# Patient Record
Sex: Male | Born: 1985 | Race: White | Hispanic: No | Smoking: Never smoker
Health system: Southern US, Community
[De-identification: ages and names within clinical notes are randomized; demographics above are authoritative.]

## PROBLEM LIST (undated history)

## (undated) DIAGNOSIS — R011 Cardiac murmur, unspecified: Secondary | ICD-10-CM

## (undated) HISTORY — PX: FRACTURE SURGERY: SHX138

---
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
## 2012-01-05 ENCOUNTER — Encounter (HOSPITAL_COMMUNITY): Payer: Self-pay | Admitting: Emergency Medicine

## 2012-01-05 ENCOUNTER — Emergency Department (HOSPITAL_COMMUNITY)
Admission: EM | Admit: 2012-01-05 | Discharge: 2012-01-05 | Disposition: A | Discharge status: Home / Self Care | Payer: Self-pay | Attending: Emergency Medicine | Admitting: Emergency Medicine

## 2012-01-05 DIAGNOSIS — K047 Periapical abscess without sinus: Secondary | ICD-10-CM

## 2012-01-05 DIAGNOSIS — K089 Disorder of teeth and supporting structures, unspecified: Secondary | ICD-10-CM | POA: Insufficient documentation

## 2012-01-05 MED ORDER — PENICILLIN V POTASSIUM 500 MG PO TABS: 500.0000 mg | ORAL_TABLET | Freq: Four times a day (QID) | ORAL | Status: AC

## 2012-01-05 MED ORDER — LIDOCAINE HCL (PF) 1 % IJ SOLN
INTRAMUSCULAR | Status: AC
  Administered 2012-01-05: 13:00:00
  Filled 2012-01-05: qty 5

## 2012-01-05 MED ORDER — HYDROCODONE-ACETAMINOPHEN 5-325 MG PO TABS: 1.0000 | ORAL_TABLET | Freq: Four times a day (QID) | ORAL | Status: AC | PRN

## 2012-01-05 MED ORDER — CEFTRIAXONE SODIUM 1 G IJ SOLR
1.0000 g | Freq: Once | INTRAMUSCULAR | Status: AC
  Administered 2012-01-05: 1 g via INTRAMUSCULAR
  Filled 2012-01-05: qty 10

## 2012-01-05 NOTE — Discharge Instructions (Signed)
Call Dr. Bonnetta Barry office today and make an appointment for tomorrow or Friday.  Tell them you were in the er today and the ER doctor stated you needed to be seen this week.

## 2012-01-05 NOTE — ED Notes (Signed)
MD at bedside. 

## 2012-01-05 NOTE — ED Notes (Signed)
Pt with poor oral hygiene, several teeth have discolored and decayed, swelling to right lower jaw x 3 days per pt, denies having a dentist, pt states it Hurts to swallow, denies difficultly breathing

## 2012-01-05 NOTE — ED Notes (Signed)
Reports the pain is radiating into his neck.

## 2012-01-05 NOTE — ED Provider Notes (Cosign Needed)
History  This chart was scribed for Douglas Lennert, MD by Shari Heritage. The patient was seen in room APA02/APA02. Patient's care was started at 1255.     CSN: [id number]  Arrival date & time 01/05/12  1100   First MD Initiated Contact with Patient 01/05/12 1255      Chief Complaint  Patient presents with  . Dental Pain    Patient is a 26 y.o. male presenting with tooth pain. The history is provided by the patient. No language interpreter was used.  Dental PainThe primary symptoms include mouth pain. Primary symptoms do not include headaches or cough. The symptoms began 3 to 5 days ago. The symptoms are unchanged. The symptoms are new. The symptoms occur constantly.  Mouth pain began 3 - 5 days ago. Mouth pain occurs constantly. Mouth pain is unchanged. Affected locations include: teeth and gum(s).  Additional symptoms include: gum swelling. Additional symptoms do not include: fatigue.    Douglas Dennis is a 26 y.o. male who presents to the Emergency Department complaining of moderate to severe, constant, non-radiating right sided dental pain with significant swelling onset 3 days ago. Patient denies any other symptoms. Patient does not have a dentist. He reports no other significant medical or surgical history. He is not a smoker.   History reviewed. No pertinent past medical history.  History reviewed. No pertinent past surgical history.  No family history on file.  History  Substance Use Topics  . Smoking status: Never Smoker   . Smokeless tobacco: Not on file  . Alcohol Use: No      Review of Systems  Constitutional: Negative for fatigue.  HENT: Positive for dental problem. Negative for congestion, sinus pressure and ear discharge.   Eyes: Negative for discharge.  Respiratory: Negative for cough.   Cardiovascular: Negative for chest pain.  Gastrointestinal: Negative for abdominal pain and diarrhea.  Genitourinary: Negative for frequency and hematuria.    Musculoskeletal: Negative for back pain.  Skin: Negative for rash.  Neurological: Negative for seizures and headaches.  Hematological: Negative.   Psychiatric/Behavioral: Negative for hallucinations.    Allergies  Review of patient's allergies indicates no known allergies.  Home Medications   Current Outpatient Rx  Name Route Sig Dispense Refill  . ACETAMINOPHEN 500 MG PO TABS Oral Take 500-1,000 mg by mouth every 6 (six) hours as needed. Pain    . CLEAR EYES OP Both Eyes Place 1 drop into both eyes daily as needed. Clean eyes Out.      BP 123/67  Pulse 64  Temp 98.5 F (36.9 C) (Oral)  Resp 18  Ht 6' (1.829 m)  Wt 160 lb (72.576 kg)  BMI 21.70 kg/m2  SpO2 100%  Physical Exam  Constitutional: He is oriented to person, place, and time. He appears well-developed.  HENT:  Head: Normocephalic and atraumatic. No trismus in the jaw.  Mouth/Throat: Normal dentition.       Swelling to right side of jaw with tenderness. No trismus. Poor dentition.  Eyes: Conjunctivae normal and EOM are normal. No scleral icterus.  Neck: Neck supple. No thyromegaly present.  Cardiovascular: Normal rate and regular rhythm.  Exam reveals no gallop and no friction rub.   No murmur heard. Pulmonary/Chest: No stridor. He has no wheezes. He has no rales. He exhibits no tenderness.  Abdominal: He exhibits no distension. There is no tenderness. There is no rebound.  Musculoskeletal: Normal range of motion. He exhibits no edema.  Lymphadenopathy:    He has  no cervical adenopathy.  Neurological: He is oriented to person, place, and time. Coordination normal.  Skin: No rash noted. No erythema.  Psychiatric: He has a normal mood and affect. His behavior is normal.    ED Course  Procedures (including critical care time)  DIAGNOSTIC STUDIES: Oxygen Saturation is 100% on room air, normal by my interpretation.    COORDINATION OF CARE: 1:01pm- Patient informed of current plan for treatment and  evaluation and agrees with plan at this time.      Labs Reviewed - No data to display No results found.   No diagnosis found.    MDM       The chart was scribed for me under my direct supervision.  I personally performed the history, physical, and medical decision making and all procedures in the evaluation of this patient.Douglas Lennert, MD 01/05/12 [phone]

## 2012-01-05 NOTE — ED Notes (Signed)
Patient with c/o right sided dental pain and swelling that started 3 days ago. Reports no dentist.

## 2012-03-14 ENCOUNTER — Emergency Department (HOSPITAL_COMMUNITY)
Admission: EM | Admit: 2012-03-14 | Discharge: 2012-03-14 | Disposition: A | Discharge status: Home / Self Care | Payer: Self-pay | Attending: Emergency Medicine | Admitting: Emergency Medicine

## 2012-03-14 ENCOUNTER — Encounter (HOSPITAL_COMMUNITY): Payer: Self-pay | Admitting: Emergency Medicine

## 2012-03-14 DIAGNOSIS — K047 Periapical abscess without sinus: Secondary | ICD-10-CM | POA: Insufficient documentation

## 2012-03-14 MED ORDER — PENICILLIN V POTASSIUM 500 MG PO TABS: 500.0000 mg | ORAL_TABLET | Freq: Three times a day (TID) | ORAL | Status: DC

## 2012-03-14 MED ORDER — HYDROCODONE-ACETAMINOPHEN 5-325 MG PO TABS: 1.0000 | ORAL_TABLET | ORAL | Status: AC | PRN

## 2012-03-14 MED ORDER — HYDROCODONE-ACETAMINOPHEN 5-325 MG PO TABS
1.0000 | ORAL_TABLET | Freq: Once | ORAL | Status: AC
  Administered 2012-03-14: 1 via ORAL
  Filled 2012-03-14: qty 1

## 2012-03-14 MED ORDER — PENICILLIN V POTASSIUM 250 MG PO TABS
500.0000 mg | ORAL_TABLET | Freq: Once | ORAL | Status: AC
  Administered 2012-03-14: 500 mg via ORAL
  Filled 2012-03-14: qty 2

## 2012-03-14 NOTE — ED Provider Notes (Signed)
History     CSN: [id number]  Arrival date & time 03/14/12  1151   First MD Initiated Contact with Patient 03/14/12 1156      Chief Complaint  Patient presents with  . Jaw Pain    (Consider location/radiation/quality/duration/timing/severity/associated sxs/prior treatment) HPI Comments: Douglas Dennis presents with a 2 day history of dental pain and gingival swelling.   The patient has a history of severe dental decay to the majority of his teet,  Describing he frequently has small pieces of tooth that just fall off when eating.  He has developed increased pain and swelling along his right jawline 2 days ago.  There has been no fevers,  Chills, nausea or vomiting, also no complaint of difficulty swallowing,  Although chewing makes pain worse.  The patient has tried tylenol without relief of symptoms.      The history is provided by the patient and the spouse.    History reviewed. No pertinent past medical history.  History reviewed. No pertinent past surgical history.  History reviewed. No pertinent family history.  History  Substance Use Topics  . Smoking status: Never Smoker   . Smokeless tobacco: Not on file  . Alcohol Use: No      Review of Systems  Constitutional: Negative for fever.  HENT: Positive for dental problem. Negative for sore throat, facial swelling, neck pain and neck stiffness.   Respiratory: Negative for shortness of breath.     Allergies  Review of patient's allergies indicates no known allergies.  Home Medications   No current outpatient prescriptions on file.  BP 149/96  Pulse 75  Temp 97.5 F (36.4 C) (Oral)  Resp 20  Ht 6\' 1"  (1.854 m)  Wt 160 lb (72.576 kg)  BMI 21.11 kg/m2  SpO2 100%  Physical Exam  Constitutional: He is oriented to person, place, and time. He appears well-developed and well-nourished. No distress.  HENT:  Head: Normocephalic and atraumatic. No trismus in the jaw.  Right Ear: Tympanic membrane and external  ear normal.  Left Ear: Tympanic membrane and external ear normal.  Mouth/Throat: Oropharynx is clear and moist and mucous membranes are normal. No oral lesions. Dental abscesses present.       Moderate small induration along right low gingiva.  Every molar tooth of mandible is fractured to the gingival line with evidence of chronic decay.  No fluctuance, no drainage.  Eyes: Conjunctivae normal are normal.  Neck: Normal range of motion. Neck supple.  Cardiovascular: Normal rate and normal heart sounds.   Pulmonary/Chest: Effort normal.  Abdominal: He exhibits no distension.  Musculoskeletal: Normal range of motion.  Lymphadenopathy:    He has no cervical adenopathy.  Neurological: He is alert and oriented to person, place, and time.  Skin: Skin is warm and dry. No erythema.  Psychiatric: He has a normal mood and affect.    ED Course  Procedures (including critical care time)  Labs Reviewed - No data to display No results found.   1. Dental abscess       MDM  Referrals given,  Prescribed penicillin,  Hydrocodone, first doses given here.        Burgess Amor, [place] 03/14/12 1222

## 2012-03-14 NOTE — ED Notes (Signed)
Pt states pain for about two days. States gum area on lower right side swollen.

## 2012-03-14 NOTE — ED Provider Notes (Signed)
Medical screening examination/treatment/procedure(s) were performed by non-physician practitioner and as supervising physician I was immediately available for consultation/collaboration.   Dione Booze, MD 03/14/12 1345

## 2012-08-02 ENCOUNTER — Encounter (HOSPITAL_COMMUNITY): Payer: Self-pay | Admitting: Emergency Medicine

## 2012-08-02 ENCOUNTER — Emergency Department (HOSPITAL_COMMUNITY)
Admission: EM | Admit: 2012-08-02 | Discharge: 2012-08-02 | Disposition: A | Discharge status: Home / Self Care | Payer: Self-pay | Attending: Emergency Medicine | Admitting: Emergency Medicine

## 2012-08-02 DIAGNOSIS — F17200 Nicotine dependence, unspecified, uncomplicated: Secondary | ICD-10-CM | POA: Insufficient documentation

## 2012-08-02 DIAGNOSIS — K089 Disorder of teeth and supporting structures, unspecified: Secondary | ICD-10-CM | POA: Insufficient documentation

## 2012-08-02 DIAGNOSIS — K0889 Other specified disorders of teeth and supporting structures: Secondary | ICD-10-CM

## 2012-08-02 MED ORDER — HYDROCODONE-ACETAMINOPHEN 5-325 MG PO TABS: 1.0000 | ORAL_TABLET | ORAL | Status: DC | PRN

## 2012-08-02 MED ORDER — AMOXICILLIN 500 MG PO CAPS: 500.0000 mg | ORAL_CAPSULE | Freq: Three times a day (TID) | ORAL | Status: DC

## 2012-08-02 NOTE — ED Provider Notes (Signed)
History     CSN: [id number]  Arrival date & time 08/02/12  1056   First MD Initiated Contact with Patient 08/02/12 1058      No chief complaint on file.   (Consider location/radiation/quality/duration/timing/severity/associated sxs/prior treatment) HPI  Patient presents to the emergency department with a dental complaint. Symptoms began 4 days ago. The patient has tried to alleviate pain with oragel and OTC meds.  Pain rated at a 10/10, characterized as throbbing in nature and located right upper and lower jaw line. Patient denies fever, night sweats, chills, difficulty swallowing or opening mouth, SOB, nuchal rigidity or decreased ROM of neck.  Patient does not have a dentist and requests a resource guide at discharge. 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 Said he didn't take care of his teeth as a child and is now waiting for them all to rot and fall out. The other day he said one was loose and he was able to just pull it out of place without pain or much bleeding. Yesterday he said right side of face was swollen. Today, its much better.  History reviewed. No pertinent past medical history.  History reviewed. No pertinent past surgical history.  History reviewed. No pertinent family history.  History  Substance Use Topics  . Smoking status: Current Every Day Smoker  . Smokeless tobacco: Not on file  . Alcohol Use: No      Review of Systems  HENT: Positive for dental problem.   All other systems reviewed and are negative.    Allergies  Review of patient's allergies indicates no known allergies.  Home Medications   Current Outpatient Rx  Name  Route  Sig  Dispense  Refill  . amoxicillin (AMOXIL) 500 MG capsule   Oral   Take 1 capsule (500 mg total) by mouth 3 (three) times daily.   21 capsule   0   . HYDROcodone-acetaminophen (NORCO/VICODIN) 5-325 MG per tablet   Oral   Take 1 tablet by mouth every 4 (four) hours as needed  for pain.   10 tablet   0     BP 136/100  Pulse 94  Temp(Src) 97.4 F (36.3 C) (Oral)  SpO2 99%  Physical Exam  Nursing note and vitals reviewed. Constitutional: He appears well-developed and well-nourished.  HENT:  Head: Normocephalic and atraumatic.  Mouth/Throat: Dental caries present.  Widespread tooth decay. Unable to identify origin of infection.  Eyes: Conjunctivae and EOM are normal. Pupils are equal, round, and reactive to light.  Neck: Normal range of motion. Neck supple.  Cardiovascular: Normal rate and regular rhythm.   Pulmonary/Chest: Effort normal and breath sounds normal.    ED Course  Procedures (including critical care time)  Labs Reviewed - No data to display No results found.   1. Pain, dental       MDM  Patient has dental pain. No emergent s/sx's present. Patent airway. No trismus.  Will be given pain medication and antibiotics. I discussed the need to call dentist within 24/48 hours for follow-up. Dental referral given. Return to ED precautions given.  Pt voiced understanding and has agreed to follow-up.          Dorthula Matas, PA-C 08/02/12 1129

## 2012-08-02 NOTE — ED Notes (Signed)
Started 2 days ago with right lower dental pain. Now pain is radiating up to right ear. Very poor dentition.

## 2012-08-04 NOTE — ED Provider Notes (Signed)
Medical screening examination/treatment/procedure(s) were performed by non-physician practitioner and as supervising physician I was immediately available for consultation/collaboration.  Pepper Kerrick T Jesiah Yerby, MD 08/04/12 0824 

## 2012-09-24 ENCOUNTER — Encounter (HOSPITAL_COMMUNITY): Payer: Self-pay | Admitting: *Deleted

## 2012-09-24 ENCOUNTER — Emergency Department (HOSPITAL_COMMUNITY)
Admission: EM | Admit: 2012-09-24 | Discharge: 2012-09-24 | Disposition: A | Discharge status: Home / Self Care | Payer: Self-pay | Attending: Emergency Medicine | Admitting: Emergency Medicine

## 2012-09-24 DIAGNOSIS — IMO0001 Reserved for inherently not codable concepts without codable children: Secondary | ICD-10-CM | POA: Insufficient documentation

## 2012-09-24 DIAGNOSIS — J3489 Other specified disorders of nose and nasal sinuses: Secondary | ICD-10-CM | POA: Insufficient documentation

## 2012-09-24 DIAGNOSIS — F17200 Nicotine dependence, unspecified, uncomplicated: Secondary | ICD-10-CM | POA: Insufficient documentation

## 2012-09-24 DIAGNOSIS — M2550 Pain in unspecified joint: Secondary | ICD-10-CM | POA: Insufficient documentation

## 2012-09-24 DIAGNOSIS — J020 Streptococcal pharyngitis: Secondary | ICD-10-CM | POA: Insufficient documentation

## 2012-09-24 DIAGNOSIS — R509 Fever, unspecified: Secondary | ICD-10-CM | POA: Insufficient documentation

## 2012-09-24 DIAGNOSIS — R52 Pain, unspecified: Secondary | ICD-10-CM | POA: Insufficient documentation

## 2012-09-24 MED ORDER — AMOXICILLIN 500 MG PO CAPS: 500.0000 mg | ORAL_CAPSULE | Freq: Three times a day (TID) | ORAL | Status: DC

## 2012-09-24 NOTE — ED Notes (Signed)
Pt c/o fever, sore throat, nasal congestion, body aches, headaches that started yesterday evening becoming worse this am,

## 2012-09-24 NOTE — ED Provider Notes (Signed)
History     CSN: [id number]  Arrival date & time 09/24/12  1312   First MD Initiated Contact with Patient 09/24/12 1325      Chief Complaint  Patient presents with  . Sore Throat    (Consider location/radiation/quality/duration/timing/severity/associated sxs/prior treatment) HPI Comments: 27 year old male presents to the emergency department complaining of sore throat beginning this morning. Patient states yesterday he began to feel sick with body aches, fever of 100.2 and nasal congestion. When he woke up this morning his fever was to 100.2, took Tylenol which provided mild relief. Sore throat beginning this morning, worse with swallowing. States his girlfriend's daughter is also sick with similar symptoms.  Patient is a 27 y.o. male presenting with pharyngitis. The history is provided by the patient.  Sore Throat Associated symptoms include arthralgias, congestion, a fever, myalgias and a sore throat. Pertinent negatives include no chest pain, chills or coughing.    History reviewed. No pertinent past medical history.  History reviewed. No pertinent past surgical history.  No family history on file.  History  Substance Use Topics  . Smoking status: Current Every Day Smoker  . Smokeless tobacco: Not on file  . Alcohol Use: No      Review of Systems  Constitutional: Positive for fever. Negative for chills.  HENT: Positive for congestion and sore throat.   Respiratory: Negative for cough and shortness of breath.   Cardiovascular: Negative for chest pain.  Musculoskeletal: Positive for myalgias and arthralgias.  All other systems reviewed and are negative.    Allergies  Review of patient's allergies indicates no known allergies.  Home Medications   Current Outpatient Rx  Name  Route  Sig  Dispense  Refill  . amoxicillin (AMOXIL) 500 MG capsule   Oral   Take 1 capsule (500 mg total) by mouth 3 (three) times daily.   21 capsule   0   . HYDROcodone-acetaminophen  (NORCO/VICODIN) 5-325 MG per tablet   Oral   Take 1 tablet by mouth every 4 (four) hours as needed for pain.   10 tablet   0     BP 134/78  Pulse 81  Temp(Src) 98.1 F (36.7 C) (Oral)  Resp 18  Ht 6\' 1"  (1.854 m)  Wt 155 lb (70.308 kg)  BMI 20.45 kg/m2  SpO2 99%  Physical Exam  Constitutional: He is oriented to person, place, and time. He appears well-developed and well-nourished. No distress.  HENT:  Head: Normocephalic and atraumatic.  Mouth/Throat: Uvula is midline and mucous membranes are normal. Posterior oropharyngeal erythema present.  Tonsils enlarged +2 bilateral with exudate.  Eyes: Conjunctivae are normal.  Neck: Normal range of motion. Neck supple.  Cardiovascular: Normal rate, regular rhythm and normal heart sounds.   Pulmonary/Chest: Effort normal and breath sounds normal.  Musculoskeletal: Normal range of motion. He exhibits no edema.  Lymphadenopathy:       Head (right side): Tonsillar adenopathy present.       Head (left side): Tonsillar adenopathy present.  Neurological: He is alert and oriented to person, place, and time.  Skin: Skin is warm and dry. He is not diaphoretic.  Psychiatric: He has a normal mood and affect. His behavior is normal.    ED Course  Procedures (including critical care time)  Labs Reviewed  RAPID STREP SCREEN - Abnormal; Notable for the following:    Streptococcus, Group A Screen (Direct) POSITIVE (*)    All other components within normal limits   No results found.   1.  Strep throat       MDM  27 year old male with strep throat. He is in no apparent distress. Temperature 98.1 in the emergency department as he gave himself Tylenol prior to arrival. Prescription for amoxicillin given. Conservative measures discussed. Patient states understanding of plan is agreeable.       Trevor Mace, PA-C 09/24/12 1359

## 2012-09-28 NOTE — ED Provider Notes (Signed)
Medical screening examination/treatment/procedure(s) were performed by non-physician practitioner and as supervising physician I was immediately available for consultation/collaboration.   Markelle Asaro W. Tyreik Delahoussaye, MD 09/28/12 1209 

## 2012-12-05 ENCOUNTER — Emergency Department (HOSPITAL_COMMUNITY)
Admission: EM | Admit: 2012-12-05 | Discharge: 2012-12-05 | Disposition: A | Discharge status: Home / Self Care | Payer: Self-pay | Attending: Emergency Medicine | Admitting: Emergency Medicine

## 2012-12-05 ENCOUNTER — Encounter (HOSPITAL_COMMUNITY): Payer: Self-pay | Admitting: *Deleted

## 2012-12-05 DIAGNOSIS — K029 Dental caries, unspecified: Secondary | ICD-10-CM | POA: Insufficient documentation

## 2012-12-05 DIAGNOSIS — R220 Localized swelling, mass and lump, head: Secondary | ICD-10-CM | POA: Insufficient documentation

## 2012-12-05 DIAGNOSIS — K089 Disorder of teeth and supporting structures, unspecified: Secondary | ICD-10-CM | POA: Insufficient documentation

## 2012-12-05 DIAGNOSIS — Z792 Long term (current) use of antibiotics: Secondary | ICD-10-CM | POA: Insufficient documentation

## 2012-12-05 DIAGNOSIS — R51 Headache: Secondary | ICD-10-CM | POA: Insufficient documentation

## 2012-12-05 DIAGNOSIS — K0889 Other specified disorders of teeth and supporting structures: Secondary | ICD-10-CM

## 2012-12-05 MED ORDER — HYDROCODONE-ACETAMINOPHEN 5-325 MG PO TABS
1.0000 | ORAL_TABLET | Freq: Once | ORAL | Status: AC
  Administered 2012-12-05: 1 via ORAL
  Filled 2012-12-05: qty 1

## 2012-12-05 MED ORDER — TRAMADOL HCL 50 MG PO TABS: 50.0000 mg | ORAL_TABLET | Freq: Four times a day (QID) | ORAL | Status: DC | PRN

## 2012-12-05 MED ORDER — AMOXICILLIN 500 MG PO CAPS: 500.0000 mg | ORAL_CAPSULE | Freq: Three times a day (TID) | ORAL | Status: DC

## 2012-12-05 MED ORDER — CLINDAMYCIN HCL 150 MG PO CAPS
300.0000 mg | ORAL_CAPSULE | Freq: Once | ORAL | Status: AC
  Administered 2012-12-05: 300 mg via ORAL
  Filled 2012-12-05: qty 2

## 2012-12-05 NOTE — ED Notes (Signed)
Dental pain ,with facial swelling , onset 2 days ago.  Took "left over" amoxicillin "

## 2012-12-05 NOTE — ED Notes (Signed)
Pt c/o left side dental pain and swelling since yesterday. Pt is unsure of drainage from area.

## 2012-12-08 NOTE — ED Provider Notes (Signed)
CSN: [id number]     Arrival date & time 12/05/12  1045 History     First MD Initiated Contact with Patient 12/05/12 1224     Chief Complaint  Patient presents with  . Dental Pain   (Consider location/radiation/quality/duration/timing/severity/associated sxs/prior Treatment) Patient is a 27 y.o. male presenting with tooth pain. The history is provided by the patient.  Dental Pain Location:  Upper Quality:  Dull, throbbing and pressure-like Severity:  Moderate Onset quality:  Gradual Duration:  1 day Timing:  Constant Progression:  Worsening Chronicity:  New Context: abscess, dental caries and poor dentition   Context: not recent dental surgery and not trauma   Relieved by:  Nothing Worsened by:  Cold food/drink, hot food/drink and pressure Ineffective treatments:  None tried Associated symptoms: facial pain, facial swelling and gum swelling   Associated symptoms: no congestion, no difficulty swallowing, no drooling, no fever, no headaches, no neck pain, no neck swelling, no oral bleeding, no oral lesions and no trismus   Risk factors: lack of dental care and periodontal disease   Risk factors: no smoking     History reviewed. No pertinent past medical history. History reviewed. No pertinent past surgical history. History reviewed. No pertinent family history. History  Substance Use Topics  . Smoking status: Never Smoker   . Smokeless tobacco: Not on file  . Alcohol Use: Yes    Review of Systems  Constitutional: Negative for fever and appetite change.  HENT: Positive for facial swelling and dental problem. Negative for congestion, sore throat, drooling, mouth sores, trouble swallowing, neck pain and neck stiffness.   Eyes: Negative for pain and visual disturbance.  Gastrointestinal: Negative for nausea, vomiting and abdominal pain.  Genitourinary: Negative for flank pain.  Skin: Negative for color change and rash.  Neurological: Negative for dizziness, facial asymmetry  and headaches.  Hematological: Negative for adenopathy.  All other systems reviewed and are negative.    Allergies  Review of patient's allergies indicates no known allergies.  Home Medications   Current Outpatient Rx  Name  Route  Sig  Dispense  Refill  . amoxicillin (AMOXIL) 500 MG capsule   Oral   Take 1 capsule (500 mg total) by mouth 3 (three) times daily.   21 capsule   0   . OVER THE COUNTER MEDICATION   Oral   Take 1 application by mouth as needed (tooth ache). [place] soft brush gel         . amoxicillin (AMOXIL) 500 MG capsule   Oral   Take 1 capsule (500 mg total) by mouth 3 (three) times daily.   30 capsule   0   . traMADol (ULTRAM) 50 MG tablet   Oral   Take 1 tablet (50 mg total) by mouth every 6 (six) hours as needed for pain.   15 tablet   0    BP 124/81  Pulse 79  Temp(Src) 97.8 F (36.6 C) (Oral)  Resp 20  Ht 6\' 1"  (1.854 m)  Wt 160 lb (72.576 kg)  BMI 21.11 kg/m2  SpO2 98% Physical Exam  Nursing note and vitals reviewed. Constitutional: He is oriented to person, place, and time. He appears well-developed and well-nourished. No distress.  HENT:  Head: Normocephalic and atraumatic.  Right Ear: Tympanic membrane and ear canal normal.  Left Ear: Tympanic membrane and ear canal normal.  Mouth/Throat: Uvula is midline, oropharynx is clear and moist and mucous membranes are normal. No trismus in the jaw. Dental caries present. No  dental abscesses or edematous.  Multiple dental caries.  ttp of the left upper teeth and erythema of the surrounding gums.  Slight left lower facial edema.  No fluctuance of the gums, trismus or sublingual abnmml  Eyes: EOM are normal. Pupils are equal, round, and reactive to light.  Neck: Normal range of motion. Neck supple.  Cardiovascular: Normal rate, regular rhythm, normal heart sounds and intact distal pulses.   No murmur heard. Pulmonary/Chest: Effort normal and breath sounds normal. No respiratory distress.   Musculoskeletal: Normal range of motion.  Lymphadenopathy:    He has no cervical adenopathy.  Neurological: He is alert and oriented to person, place, and time. He exhibits normal muscle tone. Coordination normal.  Skin: Skin is warm and dry.    ED Course   Procedures (including critical care time)  Labs Reviewed - No data to display No results found. 1. Pain, dental     MDM    Dental pain with possible early dental abscess.  No trismus.  No concerning sx's for Ludwig's angina.  Pt given referral info for dentist.  Agrees to close f/u or to return here if the facial swelling worsens.    VSS.  Patient appears stable for d/c  Milany Geck L. Elainna Eshleman, PA-C 12/08/12 0131

## 2012-12-08 NOTE — ED Provider Notes (Signed)
Medical screening examination/treatment/procedure(s) were performed by non-physician practitioner and as supervising physician I was immediately available for consultation/collaboration.   John Williamsen W. Bralee Feldt, MD 12/08/12 1833 

## 2013-02-13 ENCOUNTER — Emergency Department (HOSPITAL_COMMUNITY)
Admission: EM | Admit: 2013-02-13 | Discharge: 2013-02-13 | Disposition: A | Discharge status: Home / Self Care | Payer: Self-pay | Attending: Emergency Medicine | Admitting: Emergency Medicine

## 2013-02-13 ENCOUNTER — Encounter (HOSPITAL_COMMUNITY): Payer: Self-pay | Admitting: Emergency Medicine

## 2013-02-13 DIAGNOSIS — K029 Dental caries, unspecified: Secondary | ICD-10-CM | POA: Insufficient documentation

## 2013-02-13 DIAGNOSIS — K047 Periapical abscess without sinus: Secondary | ICD-10-CM | POA: Insufficient documentation

## 2013-02-13 MED ORDER — KETOROLAC TROMETHAMINE 30 MG/ML IJ SOLN
30.0000 mg | Freq: Once | INTRAMUSCULAR | Status: AC
  Administered 2013-02-13: 30 mg via INTRAVENOUS
  Filled 2013-02-13 (×2): qty 1

## 2013-02-13 MED ORDER — SODIUM CHLORIDE 0.9 % IV BOLUS (SEPSIS)
1000.0000 mL | Freq: Once | INTRAVENOUS | Status: AC
  Administered 2013-02-13: 1000 mL via INTRAVENOUS

## 2013-02-13 MED ORDER — AMOXICILLIN 500 MG PO CAPS: 500.0000 mg | ORAL_CAPSULE | Freq: Three times a day (TID) | ORAL | Status: DC

## 2013-02-13 MED ORDER — CLINDAMYCIN PHOSPHATE 600 MG/50ML IV SOLN
600.0000 mg | Freq: Once | INTRAVENOUS | Status: AC
  Administered 2013-02-13: 600 mg via INTRAVENOUS
  Filled 2013-02-13: qty 50

## 2013-02-13 MED ORDER — ONDANSETRON HCL 4 MG/2ML IJ SOLN
4.0000 mg | Freq: Once | INTRAMUSCULAR | Status: AC
  Administered 2013-02-13: 4 mg via INTRAVENOUS
  Filled 2013-02-13: qty 2

## 2013-02-13 MED ORDER — OXYCODONE-ACETAMINOPHEN 5-325 MG PO TABS: 2.0000 | ORAL_TABLET | ORAL | Status: DC | PRN

## 2013-02-13 NOTE — ED Provider Notes (Signed)
CSN: [id number]     Arrival date & time 02/13/13  1218 History  This chart was scribed for Douglas Hutching, MD, by Yevette Edwards, ED Scribe. This patient was seen in room APA03/APA03 and the patient's care was started at 2:40 PM.   First MD Initiated Contact with Patient 02/13/13 1420     Chief Complaint  Patient presents with  . Dental Pain   (Consider location/radiation/quality/duration/timing/severity/associated sxs/prior Treatment) Patient is a 27 y.o. male presenting with tooth pain. The history is provided by the patient. No language interpreter was used.  Dental Pain Associated symptoms: facial swelling   Associated symptoms: no fever    HPI Comments: Douglas Dennis is a 27 y.o. male who presents to the Emergency Department complaining of gradually-worsening right-sided dental pain which began two days and is associated with facial swelling. The pt rates the pain as 10/10. He used Orajel with little resolution.  He is not a smoker.   The pt will not be eligible for insurance through his employment at [employer] for two more months.   History reviewed. No pertinent past medical history. History reviewed. No pertinent past surgical history. No family history on file. History  Substance Use Topics  . Smoking status: Never Smoker   . Smokeless tobacco: Not on file  . Alcohol Use: Yes     Comment: pt denies    Review of Systems  Constitutional: Negative for fever and chills.  HENT: Positive for dental problem and facial swelling.     Allergies  Tramadol  Home Medications   Current Outpatient Rx  Name  Route  Sig  Dispense  Refill  . OVER THE COUNTER MEDICATION   Oral   Take 1 application by mouth as needed (tooth ache). Kanka soft brush gel          Triage Vitals: BP 150/87  Pulse 80  Temp(Src) 97.9 F (36.6 C) (Oral)  Resp 18  Ht 6' (1.829 m)  Wt 155 lb (70.308 kg)  BMI 21.02 kg/m2  SpO2 100%  Physical Exam  Nursing note and vitals reviewed. Constitutional:  He is oriented to person, place, and time. He appears well-developed and well-nourished.  HENT:  Head: Normocephalic and atraumatic.  Mouth/Throat: Dental caries present.  Right upper side inflamed. Multiple caries on the upper right side.   Neck: Normal range of motion. Neck supple.  Cardiovascular: Normal rate.   Pulmonary/Chest: Effort normal and breath sounds normal.  Abdominal: Soft.  Musculoskeletal: Normal range of motion.  Neurological: He is alert and oriented to person, place, and time.  Skin: Skin is warm and dry.  Psychiatric: He has a normal mood and affect.    ED Course  Procedures (including critical care time)  DIAGNOSTIC STUDIES: Oxygen Saturation is 100% on room air, normal by my interpretation.    COORDINATION OF CARE:  2:42 PM- Discussed treatment plan with patient which includes IV antibiotics and pain medication, and the patient agreed to the plan.   Labs Review Labs Reviewed - No data to display Imaging Review No results found.  EKG Interpretation   None       MDM  No diagnosis found. Significant dental caries c infection.  Rx IV Clindamycin.  Rx po Amoxicillin,   Percocet.  F/u dentist  I personally performed the services described in this documentation, which was scribed in my presence. The recorded information has been reviewed and is accurate.      Douglas Hutching, MD 02/13/13 [phone]

## 2013-02-13 NOTE — ED Notes (Signed)
Pt c/o toothache x 2 days.  Facial swelling today.

## 2014-10-11 ENCOUNTER — Encounter (HOSPITAL_COMMUNITY): Payer: Self-pay | Admitting: *Deleted

## 2014-10-11 ENCOUNTER — Emergency Department (HOSPITAL_COMMUNITY)
Admission: EM | Admit: 2014-10-11 | Discharge: 2014-10-12 | Disposition: A | Discharge status: Home / Self Care | Payer: Self-pay | Attending: Emergency Medicine | Admitting: Emergency Medicine

## 2014-10-11 DIAGNOSIS — K047 Periapical abscess without sinus: Secondary | ICD-10-CM | POA: Insufficient documentation

## 2014-10-11 DIAGNOSIS — K002 Abnormalities of size and form of teeth: Secondary | ICD-10-CM | POA: Insufficient documentation

## 2014-10-11 DIAGNOSIS — K0381 Cracked tooth: Secondary | ICD-10-CM | POA: Insufficient documentation

## 2014-10-11 MED ORDER — AMOXICILLIN 250 MG PO CAPS
1000.0000 mg | ORAL_CAPSULE | Freq: Once | ORAL | Status: AC
  Administered 2014-10-12: 1000 mg via ORAL
  Filled 2014-10-11: qty 4

## 2014-10-11 MED ORDER — AMOXICILLIN 500 MG PO CAPS: 1000.0000 mg | ORAL_CAPSULE | Freq: Two times a day (BID) | ORAL | Status: DC

## 2014-10-11 MED ORDER — OXYCODONE-ACETAMINOPHEN 5-325 MG PO TABS: 1.0000 | ORAL_TABLET | ORAL | Status: DC | PRN

## 2014-10-11 NOTE — ED Notes (Signed)
Pt states he has swelling to the right jaw on & off for the past 2 weeks.

## 2014-10-11 NOTE — ED Provider Notes (Signed)
CSN: [id number]     Arrival date & time 10/11/14  2319 History   This chart was scribed for Dione Booze, MD by Evon Slack, ED Scribe. This patient was seen in room APA05/APA05 and the patient's care was started at 11:50 PM.      Chief Complaint  Patient presents with  . Facial Swelling    The history is provided by the patient. No language interpreter was used.   HPI Comments: Douglas Dennis is a 29 y.o. male who presents to the Emergency Department complaining of intermittent right jaw swelling onset 2 weeks prior. Pt rates the severity of his pain 7/10. Pt states that he has been taking ibuprofen with no relief. Pt states that today shaving made his pain worse. Pt states that he does have a dental problem. Pt states that he needs to have several teeth removed but is unable to until he gets insurance. Denies fever, trouble swallowing or other related symptoms.   History reviewed. No pertinent past medical history. History reviewed. No pertinent past surgical history. No family history on file. History  Substance Use Topics  . Smoking status: Never Smoker   . Smokeless tobacco: Not on file  . Alcohol Use: No     Comment: pt denies    Review of Systems  Constitutional: Negative for fever.  HENT: Positive for dental problem and facial swelling. Negative for trouble swallowing.   All other systems reviewed and are negative.    Allergies  Tramadol  Home Medications   Prior to Admission medications   Medication Sig Start Date End Date Taking? Authorizing Provider  ibuprofen (ADVIL,MOTRIN) 800 MG tablet Take 800 mg by mouth every 8 (eight) hours as needed.   Yes Historical Provider, MD  amoxicillin (AMOXIL) 500 MG capsule Take 1 capsule (500 mg total) by mouth 3 (three) times daily. 02/13/13   [name], MD  OVER THE COUNTER MEDICATION Take 1 application by mouth as needed (tooth ache). Kanka soft brush gel    Historical Provider, MD  oxyCODONE-acetaminophen (PERCOCET)  5-325 MG per tablet Take 2 tablets by mouth every 4 (four) hours as needed for pain. 02/13/13   [name], MD   BP 134/85 mmHg  Pulse 62  Temp(Src) 98.1 F (36.7 C) (Oral)  Resp 18  Ht 6\' 1"  (1.854 m)  Wt 150 lb (68.04 kg)  BMI 19.79 kg/m2  SpO2 99%   Physical Exam  Constitutional: He is oriented to person, place, and time. He appears well-developed and well-nourished. No distress.  HENT:  Head: Normocephalic and atraumatic.  Extremely poor dentition. Teeth numbers 29-32 are rotted down to the gingival line, swelling and tenderness of gingival over tooth #30, swollen area of lower jaw corresponding to the infected tooth.   Eyes: Conjunctivae and EOM are normal. Pupils are equal, round, and reactive to light.  Neck: Normal range of motion. Neck supple. No JVD present.  Cardiovascular: Normal rate, regular rhythm and normal heart sounds.   No murmur heard. Pulmonary/Chest: Effort normal and breath sounds normal. He has no wheezes. He has no rales. He exhibits no tenderness.  Abdominal: Soft. Bowel sounds are normal. He exhibits no distension and no mass. There is no tenderness.  Musculoskeletal: Normal range of motion. He exhibits no edema.  Lymphadenopathy:    He has no cervical adenopathy.  Neurological: He is alert and oriented to person, place, and time. No cranial nerve deficit. He exhibits normal muscle tone. Coordination normal.  Skin: Skin is warm and  dry. No rash noted.  Psychiatric: He has a normal mood and affect. His behavior is normal. Judgment and thought content normal.  Nursing note and vitals reviewed.   ED Course  Procedures (including critical care time) DIAGNOSTIC STUDIES: Oxygen Saturation is 99% on RA, normal by my interpretation.    COORDINATION OF CARE: 11:56 PM-Discussed treatment plan with pt at bedside and pt agreed to plan.     MDM   Final diagnoses:  Dental abscess      Dental abscess with extremely poor dentition. Patient is given dental  resources and discharged with a prepack of oxycodone-acetaminophen and prescription for amoxicillin.   I personally performed the services described in this documentation, which was scribed in my presence. The recorded information has been reviewed and is accurate.       Dione Booze, MD 10/12/14 [phone]

## 2014-10-12 NOTE — ED Notes (Signed)
Pt alert & oriented x4, stable gait. Patient given discharge instructions, paperwork & prescription(s). Patient informed not to drive, operate any equipment & handel any important documents 4 hours after taking pain medication. Patient  instructed to stop at the registration desk to finish any additional paperwork. Patient  verbalized understanding. Pt left department w/ no further questions. 

## 2014-10-12 NOTE — Discharge Instructions (Signed)
Dental Abscess A dental abscess is a collection of infected fluid (pus) from a bacterial infection in the inner part of the tooth (pulp). It usually occurs at the end of the tooth's root.  CAUSES   Severe tooth decay.  Trauma to the tooth that allows bacteria to enter into the pulp, such as a broken or chipped tooth. SYMPTOMS   Severe pain in and around the infected tooth.  Swelling and redness around the abscessed tooth or in the mouth or face.  Tenderness.  Pus drainage.  Bad breath.  Bitter taste in the mouth.  Difficulty swallowing.  Difficulty opening the mouth.  Nausea.  Vomiting.  Chills.  Swollen neck glands. DIAGNOSIS   A medical and dental history will be taken.  An examination will be performed by tapping on the abscessed tooth.  X-rays may be taken of the tooth to identify the abscess. TREATMENT The goal of treatment is to eliminate the infection. You may be prescribed antibiotic medicine to stop the infection from spreading. A root canal may be performed to save the tooth. If the tooth cannot be saved, it may be pulled (extracted) and the abscess may be drained.  HOME CARE INSTRUCTIONS  Only take over-the-counter or prescription medicines for pain, fever, or discomfort as directed by your caregiver.  Rinse your mouth (gargle) often with salt water ( tsp salt in 8 oz [250 ml] of warm water) to relieve pain or swelling.  Do not drive after taking pain medicine (narcotics).  Do not apply heat to the outside of your face.  Return to your dentist for further treatment as directed. SEEK MEDICAL CARE IF:  Your pain is not helped by medicine.  Your pain is getting worse instead of better. SEEK IMMEDIATE MEDICAL CARE IF:  You have a fever or persistent symptoms for more than 2-3 days.  You have a fever and your symptoms suddenly get worse.  You have chills or a very bad headache.  You have problems breathing or swallowing.  You have trouble  opening your mouth.  You have swelling in the neck or around the eye. Document Released: 04/05/2005 Document Revised: 12/29/2011 Document Reviewed: 07/14/2010 [facility] Patient Information 2015 [place], [place]. This information is not intended to replace advice given to you by your health care provider. Make sure you discuss any questions you have with your health care provider.  Amoxicillin capsules or tablets What is this medicine? AMOXICILLIN (a mox i SIL in) is a penicillin antibiotic. It is used to treat certain kinds of bacterial infections. It will not work for colds, flu, or other viral infections. This medicine may be used for other purposes; ask your health care provider or pharmacist if you have questions. COMMON BRAND NAME(S): Amoxil, Moxilin, Sumox, Trimox What should I tell my health care provider before I take this medicine? They need to know if you have any of these conditions: -asthma -kidney disease -an unusual or allergic reaction to amoxicillin, other penicillins, cephalosporin antibiotics, other medicines, foods, dyes, or preservatives -pregnant or trying to get pregnant -breast-feeding How should I use this medicine? Take this medicine by mouth with a glass of water. Follow the directions on your prescription label. You may take this medicine with food or on an empty stomach. Take your medicine at regular intervals. Do not take your medicine more often than directed. Take all of your medicine as directed even if you think your are better. Do not skip doses or stop your medicine early. Talk to your  pediatrician regarding the use of this medicine in children. While this drug may be prescribed for selected conditions, precautions do apply. Overdosage: If you think you have taken too much of this medicine contact a poison control center or emergency room at once. NOTE: This medicine is only for you. Do not share this medicine with others. What if I miss a dose? If you miss a  dose, take it as soon as you can. If it is almost time for your next dose, take only that dose. Do not take double or extra doses. What may interact with this medicine? -amiloride -birth control pills -chloramphenicol -macrolides -probenecid -sulfonamides -tetracyclines This list may not describe all possible interactions. Give your health care provider a list of all the medicines, herbs, non-prescription drugs, or dietary supplements you use. Also tell them if you smoke, drink alcohol, or use illegal drugs. Some items may interact with your medicine. What should I watch for while using this medicine? Tell your doctor or health care professional if your symptoms do not improve in 2 or 3 days. Take all of the doses of your medicine as directed. Do not skip doses or stop your medicine early. If you are diabetic, you may get a false positive result for sugar in your urine with certain brands of urine tests. Check with your doctor. Do not treat diarrhea with over-the-counter products. Contact your doctor if you have diarrhea that lasts more than 2 days or if the diarrhea is severe and watery. What side effects may I notice from receiving this medicine? Side effects that you should report to your doctor or health care professional as soon as possible: -allergic reactions like skin rash, itching or hives, swelling of the face, lips, or tongue -breathing problems -dark urine -redness, blistering, peeling or loosening of the skin, including inside the mouth -seizures -severe or watery diarrhea -trouble passing urine or change in the amount of urine -unusual bleeding or bruising -unusually weak or tired -yellowing of the eyes or skin Side effects that usually do not require medical attention (report to your doctor or health care professional if they continue or are bothersome): -dizziness -headache -stomach upset -trouble sleeping This list may not describe all possible side effects. Call your  doctor for medical advice about side effects. You may report side effects to FDA at 1-800-FDA-1088. Where should I keep my medicine? Keep out of the reach of children. Store between 68 and 77 degrees F (20 and 25 degrees C). Keep bottle closed tightly. Throw away any unused medicine after the expiration date. NOTE: This sheet is a summary. It may not cover all possible information. If you have questions about this medicine, talk to your doctor, pharmacist, or health care provider.  2015, Elsevier/Gold Standard. (2007-06-27 14:10:59)   Emergency Department Resource Guide 1) Find a Doctor and Pay Out of Pocket Although you won't have to find out who is covered by your insurance plan, it is a good idea to ask around and get recommendations. You will then need to call the office and see if the doctor you have chosen will accept you as a new patient and what types of options they offer for patients who are self-pay. Some doctors offer discounts or will set up payment plans for their patients who do not have insurance, but you will need to ask so you aren't surprised when you get to your appointment.  2) Contact Your Local Health Department Not all health departments have doctors that can see patients  for sick visits, but many do, so it is worth a call to see if yours does. If you don't know where your local health department is, you can check in your phone book. The CDC also has a tool to help you locate your state's health department, and many state websites also have listings of all of their local health departments.  3) Find a Walk-in Clinic If your illness is not likely to be very severe or complicated, you may want to try a walk in clinic. These are popping up all over the country in pharmacies, drugstores, and shopping centers. They're usually staffed by nurse practitioners or physician assistants that have been trained to treat common illnesses and complaints. They're usually fairly quick and  inexpensive. However, if you have serious medical issues or chronic medical problems, these are probably not your best option.  No Primary Care Doctor: - Call Health Connect at  [phone] - they can help you locate a primary care doctor that  accepts your insurance, provides certain services, etc. - Physician Referral [phone]  Medication Assistance: Organization         Address  Phone   Notes  [facility] Medication Assistance Program [address] [place], [place] [address] [phone] --Must be a resident of [facility] -- Must have NO insurance coverage whatsoever (no Medicaid/ Medicare, etc.) -- The pt. MUST have a primary care doctor that directs their care regularly and follows them in the community   [facility]  [phone]   [employer]  [phone]    Agencies that provide inexpensive medical care: Organization         Address  Phone   Notes  [facility]  [phone]   [facility]    [phone]   [facility] [address] [place], [place] [address] [phone]   [facility] [address] [place] [phone]   [facility]anned Parenthood    [phone]   [facility]    [phone]   [facility]  [address], [place] Phone:  [phone], Fax:  [phone] Hours of Operation:  9 am - 6 pm, M-F.  Also accepts Medicaid/Medicare and self-pay.  [facility]  [address], [place] Phone: [phone], Fax: [phone]. Hours of Operation:  8:30 am - 5:30 pm, M-F.  Also accepts Medicaid and self-pay.  [facility] [address], [place] Phone: [phone]   [facility] [address] Natasha Bence [place], [place] [phone], Ext. [phone] Mondays & Thursdays: 7-9 AM.  First 15 patients are seen on a first come, first serve basis.     Medicaid-accepting [facility] Providers:  Organization         Address  Phone   Notes  [facility] [address], [place] [phone] Also accepts self-pay patients.  [facility] [address] Laurell Josephs [place], [place]  [phone]   [facility] [address], [place] [phone]   [facility] [address] [place] [phone]   Renaye Rakers [address], [place]   [phone] Only accepts [place] patients after they have their name applied to their card.   Self-Pay (no insurance) in [place]:  Organization         Address  Phone   Notes  Sickle Cell Patients, [facility] [place] [phone]   [facility] [place] [phone]   [facility] [place]  [address], [place] [phone]   [facility]/Dr. Osei-Bonsu  [address], [place] or [address], [place] [phone] Phone number for both [place] and [place] locations is the same.  [facility] [address] [place] [phone]   [facility] [address], [place] or [address] [phone]   [facility] [address], [place] [phone], phone; [phone], fax Sees patients 1st and 3rd Saturday of every month.  Must not qualify for public or private insurance (i.e. Medicaid, Medicare, [place] Health Choice, Veterans' Benefits)  Household income should be no more than 200% of the poverty level The clinic cannot treat you if you are pregnant or think you are pregnant  Sexually transmitted diseases are not treated at the clinic.    Dental Care: Organization         Address  Phone  Notes  [facility]  Department of [facility] [phone] Accepts children up to age 98 who are enrolled in [place] or [place]; pregnant women with a Medicaid card; and children who have applied for Medicaid or [place] Health Choice, but were declined, whose parents can pay a reduced fee at time of service.  [facility] Department of [facility]  [address], [place] [phone] Accepts children up to age 50 who are enrolled in [place] or [place]; pregnant women with a Medicaid card; and children who have applied for Medicaid or  Health Choice, but were declined, whose parents can pay a reduced fee at time of service.  [facility]  [place] [phone] Patients are seen by appointment only. Walk-ins are not accepted. [place] will see patients 33 years of age and older. Monday - Tuesday (8am-5pm) Most Wednesdays (8:30-5pm) $30 per visit, cash only  [facility]  [address], [facility] [phone] Patients are seen by appointment only. Walk-ins are not accepted. [place] will see patients 24 years of age and older. One Wednesday Evening (Monthly: Volunteer Based).  $30 per visit, cash only  [place]  [phone] for adults; Children under age 3, call Graduate Pediatric Dentistry at [phone]. Children aged 20-14, please call [phone] to request a pediatric application.  Dental services are provided in all areas of dental care including fillings, crowns and bridges, complete and partial dentures, implants, gum treatment, root canals, and extractions. Preventive care is also provided. Treatment is provided to both adults and children. Patients are selected via a lottery and there is often a waiting list.   [facility] [address] [place]  [phone]  [email or web address]   [facility] [address] [place], [place] [phone], Ext. [phone] Second and Fourth Thursday of each month, opens at 6:30 AM; Clinic ends at 9 AM.  Patients are seen on a first-come first-served basis, and a limited number are seen during each clinic.  [facility]  [address] Ether Griffins [place], [place] [phone]   Eligibility Requirements You must have lived in [place], [place], or [place] counties for at least the last three months.   You cannot be eligible for state or federal sponsored [place], including [employer], [place], or [employer].   You generally cannot be eligible for healthcare insurance through your employer.    How to apply: Eligibility screenings are held every Tuesday and Wednesday afternoon from 1:00 pm until 4:00 pm. You do not need an appointment for the interview!  [facility] [address], [place], [place] [phone]   [facility] Health Department  [phone]   [facility] Health Department  [phone]   [facility] Health Department  [phone]

## 2014-10-14 MED FILL — Oxycodone w/ Acetaminophen Tab 5-325 MG: ORAL | Qty: 6 | Status: AC

## 2014-12-26 ENCOUNTER — Emergency Department (HOSPITAL_COMMUNITY)
Admission: EM | Admit: 2014-12-26 | Discharge: 2014-12-28 | Disposition: A | Discharge status: Other Acute Inpatient Hospital | Payer: Self-pay | Attending: Emergency Medicine | Admitting: Emergency Medicine

## 2014-12-26 ENCOUNTER — Emergency Department (HOSPITAL_COMMUNITY): Payer: Self-pay

## 2014-12-26 DIAGNOSIS — Y9389 Activity, other specified: Secondary | ICD-10-CM | POA: Insufficient documentation

## 2014-12-26 DIAGNOSIS — F319 Bipolar disorder, unspecified: Secondary | ICD-10-CM | POA: Insufficient documentation

## 2014-12-26 DIAGNOSIS — Y9289 Other specified places as the place of occurrence of the external cause: Secondary | ICD-10-CM | POA: Insufficient documentation

## 2014-12-26 DIAGNOSIS — Z792 Long term (current) use of antibiotics: Secondary | ICD-10-CM | POA: Insufficient documentation

## 2014-12-26 DIAGNOSIS — F329 Major depressive disorder, single episode, unspecified: Secondary | ICD-10-CM

## 2014-12-26 DIAGNOSIS — T50904A Poisoning by unspecified drugs, medicaments and biological substances, undetermined, initial encounter: Secondary | ICD-10-CM

## 2014-12-26 DIAGNOSIS — F32A Depression, unspecified: Secondary | ICD-10-CM

## 2014-12-26 DIAGNOSIS — R4781 Slurred speech: Secondary | ICD-10-CM | POA: Insufficient documentation

## 2014-12-26 DIAGNOSIS — F1210 Cannabis abuse, uncomplicated: Secondary | ICD-10-CM | POA: Insufficient documentation

## 2014-12-26 DIAGNOSIS — Y998 Other external cause status: Secondary | ICD-10-CM | POA: Insufficient documentation

## 2014-12-26 DIAGNOSIS — T404X4A Poisoning by other synthetic narcotics, undetermined, initial encounter: Secondary | ICD-10-CM | POA: Insufficient documentation

## 2014-12-26 DIAGNOSIS — T1491XA Suicide attempt, initial encounter: Secondary | ICD-10-CM

## 2014-12-26 LAB — URINALYSIS, ROUTINE W REFLEX MICROSCOPIC
Bilirubin Urine: NEGATIVE
GLUCOSE, UA: NEGATIVE mg/dL
HGB URINE DIPSTICK: NEGATIVE
KETONES UR: NEGATIVE mg/dL
Leukocytes, UA: NEGATIVE
Nitrite: NEGATIVE
PH: 6.5 (ref 5.0–8.0)
PROTEIN: NEGATIVE mg/dL
Specific Gravity, Urine: 1.01 (ref 1.005–1.030)
Urobilinogen, UA: 0.2 mg/dL (ref 0.0–1.0)

## 2014-12-26 LAB — COMPREHENSIVE METABOLIC PANEL
ALBUMIN: 3.8 g/dL (ref 3.5–5.0)
ALK PHOS: 50 U/L (ref 38–126)
ALT: 19 U/L (ref 17–63)
AST: 25 U/L (ref 15–41)
Anion gap: 5 (ref 5–15)
BILIRUBIN TOTAL: 0.5 mg/dL (ref 0.3–1.2)
BUN: 10 mg/dL (ref 6–20)
CALCIUM: 8.8 mg/dL — AB (ref 8.9–10.3)
CO2: 28 mmol/L (ref 22–32)
CREATININE: 0.88 mg/dL (ref 0.61–1.24)
Chloride: 108 mmol/L (ref 101–111)
GFR calc Af Amer: >60 mL/min (ref >60)
GFR calc non Af Amer: >60 mL/min (ref >60)
GLUCOSE: 79 mg/dL (ref 65–99)
Potassium: 3.9 mmol/L (ref 3.5–5.1)
Sodium: 141 mmol/L (ref 135–145)
TOTAL PROTEIN: 6.6 g/dL (ref 6.5–8.1)

## 2014-12-26 LAB — CBC WITH DIFFERENTIAL/PLATELET
BASOS ABS: 0 10*3/uL (ref 0.0–0.1)
Basophils Relative: 0 % (ref 0–1)
Eosinophils Absolute: 0.1 10*3/uL (ref 0.0–0.7)
Eosinophils Relative: 1 % (ref 0–5)
HEMATOCRIT: 38.9 % — AB (ref 39.0–52.0)
HEMOGLOBIN: 13.3 g/dL (ref 13.0–17.0)
LYMPHS PCT: 37 % (ref 12–46)
Lymphs Abs: 1.8 10*3/uL (ref 0.7–4.0)
MCH: 31.3 pg (ref 26.0–34.0)
MCHC: 34.2 g/dL (ref 30.0–36.0)
MCV: 91.5 fL (ref 78.0–100.0)
MONO ABS: 0.3 10*3/uL (ref 0.1–1.0)
Monocytes Relative: 6 % (ref 3–12)
NEUTROS ABS: 2.7 10*3/uL (ref 1.7–7.7)
NEUTROS PCT: 56 % (ref 43–77)
Platelets: 214 10*3/uL (ref 150–400)
RBC: 4.25 MIL/uL (ref 4.22–5.81)
RDW: 12.1 % (ref 11.5–15.5)
WBC: 4.8 10*3/uL (ref 4.0–10.5)

## 2014-12-26 LAB — RAPID URINE DRUG SCREEN, HOSP PERFORMED
Amphetamines: NOT DETECTED
BARBITURATES: NOT DETECTED
Benzodiazepines: NOT DETECTED
Cocaine: NOT DETECTED
Opiates: NOT DETECTED
Tetrahydrocannabinol: POSITIVE — AB

## 2014-12-26 LAB — SALICYLATE LEVEL

## 2014-12-26 LAB — ACETAMINOPHEN LEVEL

## 2014-12-26 LAB — CK: Total CK: 241 U/L (ref 49–397)

## 2014-12-26 LAB — ETHANOL: Alcohol, Ethyl (B): 18 mg/dL — ABNORMAL HIGH (ref <5)

## 2014-12-26 LAB — TROPONIN I

## 2014-12-26 MED ORDER — SODIUM CHLORIDE 0.9 % IV SOLN
Freq: Once | INTRAVENOUS | Status: AC
  Administered 2014-12-26: 15:00:00 via INTRAVENOUS

## 2014-12-26 MED ORDER — SODIUM CHLORIDE 0.9 % IV BOLUS (SEPSIS): 1000.0000 mL | Freq: Once | INTRAVENOUS | Status: DC

## 2014-12-26 MED ORDER — SODIUM CHLORIDE 0.9 % IV BOLUS (SEPSIS)
1000.0000 mL | Freq: Once | INTRAVENOUS | Status: AC
Start: 2014-12-26 — End: 2014-12-26
  Administered 2014-12-26: 1000 mL via INTRAVENOUS

## 2014-12-26 NOTE — ED Notes (Signed)
Dr Adriana Simas in room.  Pt says he took 5-6 tramadol.  He is not sure how many he took.

## 2014-12-26 NOTE — ED Notes (Signed)
Pt said it was ok to give any information to his girlfriend, Lowella Bandy. Phone number [phone].

## 2014-12-26 NOTE — BH Assessment (Signed)
Assessment Note  Douglas Dennis is an 29 y.o. male stating he was brought to [facility] by his girlfriend. Patient sts that his girlfriend thoughts he was having a stroke evidenced by slurred speech. Sts that he and girlfriend had a bad argument earlier today. Subsequent of patient arguing with his girlfriend patient took #7 Tramdol pills. Patient sts that taking pills was a suicide attempt. Patient has tried to commit suicide yrs ago by [place] hoping to get his by a train. Patient denies HI and AVH's. He denies alcohol and drug use; UDS + for THC. Patient does not have a psychiatrist or therapist. No history of inpatient psych treatment.   Axis I: Major Depressive Disorder, Recurrent, Severe, without psychotic features Axis II: Deferred Axis III: No past medical history on file. Axis IV: other psychosocial or environmental problems, problems related to social environment, problems with access to health care services and problems with primary support group Axis V: 31-40 impairment in reality testing  Past Medical History: No past medical history on file.  No past surgical history on file.  Family History: No family history on file.  Social History:  reports that he has never smoked. He does not have any smokeless tobacco history on file. He reports that he does not drink alcohol or use illicit drugs.  Additional Social History:  Alcohol / Drug Use Pain Medications: SEE MAR Prescriptions: SEE MAR Over the Counter: SEE MAR History of alcohol / drug use?:  (Patient denies; UDS + for THC)  CIWA: CIWA-Ar BP: 139/90 mmHg Pulse Rate: 65 COWS:    Allergies:  Allergies  Allergen Reactions  . Tramadol     itching    Home Medications:  (Not in a hospital admission)  OB/GYN Status:  No LMP for male patient.  General Assessment Data Location of Assessment: [facility] TTS Assessment: In system Is this a Tele or Face-to-Face Assessment?: Face-to-Face Is this an Initial Assessment  or a Re-assessment for this encounter?: Initial Assessment Marital status: Single Maiden name:  (n/a) Is patient pregnant?: No Pregnancy Status: No Living Arrangements: Other (Comment), Spouse/significant other (lives with girlfriend ) Can pt return to current living arrangement?: Yes Admission Status: Voluntary Is patient capable of signing voluntary admission?: Yes Referral Source: Self/Family/Friend Insurance type:  (Self Pay )  Medical Screening Exam [facility] Walk-in ONLY) Medical Exam completed: No  Crisis Care Plan Living Arrangements: Other (Comment), Spouse/significant other (lives with girlfriend ) Name of Psychiatrist:  (None reported) Name of Therapist:  (None reported)  Education Status Is patient currently in school?: No Current Grade:  (n/a) Highest grade of school patient has completed:  (n/a) Name of school:  (n/a) Contact person:  (n/a)  Risk to self with the past 6 months Suicidal Ideation: Yes-Currently Present Has patient been a risk to self within the past 6 months prior to admission? : Yes Suicidal Intent: Yes-Currently Present Has patient had any suicidal intent within the past 6 months prior to admission? : Yes Is patient at risk for suicide?: Yes Suicidal Plan?: Yes-Currently Present Has patient had any suicidal plan within the past 6 months prior to admission? : Yes Specify Current Suicidal Plan:  (overdose on Tramadol) Access to Means: No What has been your use of drugs/alcohol within the last 12 months?:  (patient denies ) Previous Attempts/Gestures: Yes How many times?:  (1x-jump off a bridge) Other Self Harm Risks:  (n/a) Intentional Self Injurious Behavior: None Family Suicide History: No Persecutory voices/beliefs?: No Depression:  Yes Depression Symptoms: Feeling angry/irritable, Feeling worthless/self pity, Loss of interest in usual pleasures, Isolating, Guilt, Fatigue, Tearfulness, Insomnia, Despondent Substance abuse history and/or  treatment for substance abuse?: No Suicide prevention information given to non-admitted patients: Not applicable  Risk to Others within the past 6 months Homicidal Ideation: No Does patient have any lifetime risk of violence toward others beyond the six months prior to admission? : No Thoughts of Harm to Others: No Current Homicidal Intent: No Current Homicidal Plan: No Access to Homicidal Means: No Identified Victim:  (n/a) History of harm to others?: No Assessment of Violence: None Noted Violent Behavior Description:  (patient is calm and cooperative ) Does patient have access to weapons?: No Criminal Charges Pending?: No Does patient have a court date: No Is patient on probation?: No  Psychosis Hallucinations: None noted Delusions: None noted  Mental Status Report Appearance/Hygiene: Disheveled Eye Contact: Good Motor Activity: Freedom of movement Speech: Logical/coherent Level of Consciousness: Alert Mood: Depressed Affect: Appropriate to circumstance Anxiety Level: None Thought Processes: Tangential Judgement: Impaired Orientation: Person, Situation, Place, Time Obsessive Compulsive Thoughts/Behaviors: None  Cognitive Functioning Concentration: Decreased Memory: Recent Intact, Remote Intact IQ: Average Insight: Poor Impulse Control: Fair Appetite: Fair Weight Loss:  (n/a) Weight Gain:  (n/a) Sleep: Decreased Total Hours of Sleep:  (varies ) Vegetative Symptoms: None  ADLScreening [facility] Assessment Services) Patient's cognitive ability adequate to safely complete daily activities?: No Patient able to express need for assistance with ADLs?: Yes Independently performs ADLs?: Yes (appropriate for developmental age)  Prior Inpatient Therapy Prior Inpatient Therapy: No Prior Therapy Dates:  (n/a) Prior Therapy Facilty/Provider(s):  (n/a) Reason for Treatment:  (n/a)  Prior Outpatient Therapy Prior Outpatient Therapy: No Prior Therapy Dates:  (n/a) Prior  Therapy Facilty/Provider(s):  (n/a) Reason for Treatment:  (n/a) Does patient have an ACCT team?: No Does patient have Intensive In-House Services?  : No Does patient have [facility] services? : No Does patient have P4CC services?: No  ADL Screening (condition at time of admission) Patient's cognitive ability adequate to safely complete daily activities?: No Is the patient deaf or have difficulty hearing?: No Does the patient have difficulty seeing, even when wearing glasses/contacts?: No Does the patient have difficulty concentrating, remembering, or making decisions?: Yes Patient able to express need for assistance with ADLs?: Yes Does the patient have difficulty dressing or bathing?: No Independently performs ADLs?: Yes (appropriate for developmental age) Does the patient have difficulty walking or climbing stairs?: No Weakness of Legs: None Weakness of Arms/Hands: None  Home Assistive Devices/Equipment Home Assistive Devices/Equipment: None    Abuse/Neglect Assessment (Assessment to be complete while patient is alone) Physical Abuse: Denies Verbal Abuse: Denies Sexual Abuse: Denies Exploitation of patient/patient's resources: Denies Self-Neglect: Denies Values / Beliefs Cultural Requests During Hospitalization: None Spiritual Requests During Hospitalization: None   Advance Directives (For Healthcare) Does patient have an advance directive?: No Would patient like information on creating an advanced directive?: No - patient declined information    Additional Information 1:1 In Past 12 Months?: No CIRT Risk: No Elopement Risk: No Does patient have medical clearance?: Yes     Disposition:  Disposition Initial Assessment Completed for this Encounter: Yes Julieanne Cotton, NP recommends overnight observation )  On Site Evaluation by:   Reviewed with Physician:    Octaviano Batty 12/26/2014 5:05 PM

## 2014-12-26 NOTE — ED Notes (Signed)
Pt c/o having slurred speech that started this morning. Pt c/o right arm "locking up". Pt presents with slurred speech, difficult to understand. Dr. Adriana Simas was notified and he assessed pt at bedside and said not to initiate Code Stroke. Pt reports taking 5 tablets of Tramadol this morning due to headache was wasn't getting any better. When pt was asked why he took 5 tablets of Tramadol he became tearful along with girlfriend at bedside and said "I was trying to hurt myself because we had a big fight this morning, honestly I took 6 or 8 Tramadol, not 5". Pt was asked if he was suicidal and he stated, "I would be lying if I said no". Once again I asked pt if he was suicidal and he stated, "I wasn't trying to kill myself, just hurt myself really bad". Pt denies any further intent or plan to harm or kill himself. Dr. Rubin Payor now taking over for Dr. Adriana Simas and he was notified of pt's intent to harm himself earlier this morning. Pt said he took the Tramadol about 0300 this morning.

## 2014-12-26 NOTE — ED Notes (Signed)
C/o slurred speech since 1100 this am.  Worked outside yesterday from 7 am to 5 pm, c/o chest discomfort yesterday.  Dr Adriana Simas notified.  Pt speech is slurred and having rambling words.  C/o headache 7/10.

## 2014-12-26 NOTE — ED Notes (Signed)
Patient resting in bed, eyes closed, even rise and fall of chest. NAD noted. Patient remains on cardiac monitor. Sitter is at bedside.

## 2014-12-26 NOTE — ED Notes (Signed)
Pt given dinner tray.
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
## 2014-12-26 NOTE — ED Notes (Signed)
MD at bedside. 

## 2014-12-26 NOTE — BH Assessment (Signed)
Per Julieanne Cotton, NP observe overnight. Psychiatry to re-evaluate in the morning. Updated patient's nurse-Meagan with disposition.

## 2014-12-26 NOTE — BH Assessment (Signed)
12/26/2014  Per Julieanne Cotton, NP observe overnight. Psychiatry to re-evaluate in the morning.

## 2014-12-27 DIAGNOSIS — T1491XA Suicide attempt, initial encounter: Secondary | ICD-10-CM
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
## 2014-12-27 NOTE — BH Assessment (Signed)
Douglas Dennis, [facility] at [facility], confirms room 400-2 is available. Notified Hulan Fess, NP accepted Pt to the service of Dr. Madaline Guthrie Cobos. Notified Dr. Vanetta Mulders and Delila Pereyra, RN of acceptance.  Harlin Rain Patsy [place], LPC, [facility] Triage Specialist [phone]

## 2014-12-27 NOTE — ED Notes (Signed)
Spoke with pt's mother who is requesting that we notify TTS that she wants pt released into her care. She states he "just did this for attention from his girlfriend after she broke up with him" and that "he's not suicidal". Pt's mother also requesting to be notified when pt has a bed at another facility if he is not able to go home in her care. Will pass this information along to next shift.

## 2014-12-27 NOTE — Progress Notes (Signed)
Seeking inpt tx for pt as recommended by psychiatry. Also considered for [facility] admission upon bed availability.  Referred to the following facilities with expected bed availability today: [facility]- per [facility]- per Tommy Medal- per [facility]- per Gillie Manners, MSW, LCSW Clinical Social Work, Disposition  12/27/2014 [phone]

## 2014-12-27 NOTE — ED Provider Notes (Signed)
Patient accepted for inpatient psychiatric care by Dr. Jama Flavors. Patient will be transferred to [facility]. Patient is voluntary. Behavioral Health recommended inpatient admission.  Vanetta Mulders, MD 12/27/14 2214

## 2014-12-27 NOTE — ED Notes (Signed)
After speaking with Dr.Zammit pt removed to tele. Pt awake and alert up moving around NAD noted.

## 2014-12-27 NOTE — Consult Note (Signed)
Telepsych Consultation   Reason for Consult:  Discharge disposition for suspected suicide attempt  Referring Physician:  Eugenio Saenz Physician  Patient Identification: Douglas Dennis MRN:  [id number] Principal Diagnosis: Suicide attempt Diagnosis:   Patient Active Problem List   Diagnosis Date Noted  . Suicide attempt [T14.91] 12/27/2014    Total Time spent with patient: 45 minutes  Subjective:   Douglas Dennis is a 29 y.o. male patient admitted with suspected overdose on ultram yesterday. He reports "My girlfriend brought me. I took some pills. I put some in my mouth but spit the others out. I may have taken a few. Then I started to have slurred speech and my girlfriend got worried and took me to the ED. I was upset that she threatened to leave me. I have a new job that I need to be at. I am not suicidal and I have never attempted before."   HPI:   Douglas Dennis is 29 year old who presented to the [place] after taking ultram. He has a history of depression but is not receiving any current treatment. The patient denies a history of suicide attempt. However, notes in epic indicate that he has a previous attempt [place]. The patient is currently requesting to be discharged from the hospital. He reports having job that he needs to be there in the morning tomorrow or will lose the job. Because of prior history of suicidal attempt, will recommend inpatient admission for mood stabilization treatment and counseling services.    HPI Elements:   Location:  Suspected suicide attempt . Quality:  Overdosed on ultram. Severity:  Moderate . Timing:  Last night. Duration:  Acute. Context:  Relationship problems and new job .  Past Medical History: No past medical history on file. No past surgical history on file. Family History: No family history on file. Social History:  History  Alcohol Use No    Comment: pt denies     History  Drug Use No    Comment: pt denies     Social History   Social History  . Marital Status: Legally Separated    Spouse Name: N/A  . Number of Children: N/A  . Years of Education: N/A   Social History Main Topics  . Smoking status: Never Smoker   . Smokeless tobacco: Not on file  . Alcohol Use: No     Comment: pt denies  . Drug Use: No     Comment: pt denies  . Sexual Activity: Not on file   Other Topics Concern  . Not on file   Social History Narrative   Additional Social History:    Pain Medications: SEE MAR Prescriptions: SEE MAR Over the Counter: SEE MAR History of alcohol / drug use?:  (Patient denies; UDS + for THC)                     Allergies:   Allergies  Allergen Reactions  . Tramadol     itching    Labs:  Results for orders placed or performed during the hospital encounter of 12/26/14 (from the past 48 hour(s))  CBC with Differential     Status: Abnormal   Collection Time: 12/26/14  1:00 PM  Result Value Ref Range   WBC 4.8 4.0 - 10.5 K/uL   RBC 4.25 4.22 - 5.81 MIL/uL   Hemoglobin 13.3 13.0 - 17.0 g/dL   HCT 38.9 (L) 39.0 - 52.0 %   MCV 91.5  78.0 - 100.0 fL   MCH 31.3 26.0 - 34.0 pg   MCHC 34.2 30.0 - 36.0 g/dL   RDW 12.1 11.5 - 15.5 %   Platelets 214 150 - 400 K/uL   Neutrophils Relative % 56 43 - 77 %   Neutro Abs 2.7 1.7 - 7.7 K/uL   Lymphocytes Relative 37 12 - 46 %   Lymphs Abs 1.8 0.7 - 4.0 K/uL   Monocytes Relative 6 3 - 12 %   Monocytes Absolute 0.3 0.1 - 1.0 K/uL   Eosinophils Relative 1 0 - 5 %   Eosinophils Absolute 0.1 0.0 - 0.7 K/uL   Basophils Relative 0 0 - 1 %   Basophils Absolute 0.0 0.0 - 0.1 K/uL  Ethanol     Status: Abnormal   Collection Time: 12/26/14  1:00 PM  Result Value Ref Range   Alcohol, Ethyl (B) 18 (H) <5 mg/dL    Comment:        LOWEST DETECTABLE LIMIT FOR SERUM ALCOHOL IS 5 mg/dL FOR MEDICAL PURPOSES ONLY   Comprehensive metabolic panel     Status: Abnormal   Collection Time: 12/26/14  1:00 PM  Result Value Ref Range   Sodium 141  135 - 145 mmol/L   Potassium 3.9 3.5 - 5.1 mmol/L   Chloride 108 101 - 111 mmol/L   CO2 28 22 - 32 mmol/L   Glucose, Bld 79 65 - 99 mg/dL   BUN 10 6 - 20 mg/dL   Creatinine, Ser 0.88 0.61 - 1.24 mg/dL   Calcium 8.8 (L) 8.9 - 10.3 mg/dL   Total Protein 6.6 6.5 - 8.1 g/dL   Albumin 3.8 3.5 - 5.0 g/dL   AST 25 15 - 41 U/L   ALT 19 17 - 63 U/L   Alkaline Phosphatase 50 38 - 126 U/L   Total Bilirubin 0.5 0.3 - 1.2 mg/dL   GFR calc non Af Amer >60 >60 mL/min   GFR calc Af Amer >60 >60 mL/min    Comment: (NOTE) The eGFR has been calculated using the CKD EPI equation. This calculation has not been validated in all clinical situations. eGFR's persistently <60 mL/min signify possible Chronic Kidney Disease.    Anion gap 5 5 - 15  Troponin I     Status: None   Collection Time: 12/26/14  1:00 PM  Result Value Ref Range   Troponin I <0.03 <0.031 ng/mL    Comment:        NO INDICATION OF MYOCARDIAL INJURY.   Salicylate level     Status: None   Collection Time: 12/26/14  1:00 PM  Result Value Ref Range   Salicylate Lvl <2.4 2.8 - 30.0 mg/dL  CK     Status: None   Collection Time: 12/26/14  1:00 PM  Result Value Ref Range   Total CK 241 49 - 397 U/L  Acetaminophen level     Status: Abnormal   Collection Time: 12/26/14  1:00 PM  Result Value Ref Range   Acetaminophen (Tylenol), Serum <10 (L) 10 - 30 ug/mL    Comment:        THERAPEUTIC CONCENTRATIONS VARY SIGNIFICANTLY. A RANGE OF 10-30 ug/mL MAY BE AN EFFECTIVE CONCENTRATION FOR MANY PATIENTS. HOWEVER, SOME ARE BEST TREATED AT CONCENTRATIONS OUTSIDE THIS RANGE. ACETAMINOPHEN CONCENTRATIONS >150 ug/mL AT 4 HOURS AFTER INGESTION AND >50 ug/mL AT 12 HOURS AFTER INGESTION ARE OFTEN ASSOCIATED WITH TOXIC REACTIONS.   Urine rapid drug screen (hosp performed)     Status:  Abnormal   Collection Time: 12/26/14  2:19 PM  Result Value Ref Range   Opiates NONE DETECTED NONE DETECTED   Cocaine NONE DETECTED NONE DETECTED    Benzodiazepines NONE DETECTED NONE DETECTED   Amphetamines NONE DETECTED NONE DETECTED   Tetrahydrocannabinol POSITIVE (A) NONE DETECTED   Barbiturates NONE DETECTED NONE DETECTED    Comment:        DRUG SCREEN FOR MEDICAL PURPOSES ONLY.  IF CONFIRMATION IS NEEDED FOR ANY PURPOSE, NOTIFY LAB WITHIN 5 DAYS.        LOWEST DETECTABLE LIMITS FOR URINE DRUG SCREEN Drug Class       Cutoff (ng/mL) Amphetamine      1000 Barbiturate      200 Benzodiazepine   703 Tricyclics       500 Opiates          300 Cocaine          300 THC              50   Urinalysis, Routine w reflex microscopic     Status: None   Collection Time: 12/26/14  2:19 PM  Result Value Ref Range   Color, Urine YELLOW YELLOW   APPearance CLEAR CLEAR   Specific Gravity, Urine 1.010 1.005 - 1.030   pH 6.5 5.0 - 8.0   Glucose, UA NEGATIVE NEGATIVE mg/dL   Hgb urine dipstick NEGATIVE NEGATIVE   Bilirubin Urine NEGATIVE NEGATIVE   Ketones, ur NEGATIVE NEGATIVE mg/dL   Protein, ur NEGATIVE NEGATIVE mg/dL   Urobilinogen, UA 0.2 0.0 - 1.0 mg/dL   Nitrite NEGATIVE NEGATIVE   Leukocytes, UA NEGATIVE NEGATIVE    Comment: MICROSCOPIC NOT DONE ON URINES WITH NEGATIVE PROTEIN, BLOOD, LEUKOCYTES, NITRITE, OR GLUCOSE <1000 mg/dL.    Vitals: Blood pressure 127/77, pulse 68, temperature 98.2 F (36.8 C), temperature source Oral, resp. rate 16, height 6' (1.829 m), weight 70.308 kg (155 lb), SpO2 100 %.  Risk to Self: Suicidal Ideation: Yes-Currently Present Suicidal Intent: Yes-Currently Present Is patient at risk for suicide?: Yes Suicidal Plan?: Yes-Currently Present Specify Current Suicidal Plan:  (overdose on Tramadol) Access to Means: No What has been your use of drugs/alcohol within the last 12 months?:  (patient denies ) How many times?:  (1x-jump off a bridge) Other Self Harm Risks:  (n/a) Intentional Self Injurious Behavior: None Risk to Others: Homicidal Ideation: No Thoughts of Harm to Others: No Current  Homicidal Intent: No Current Homicidal Plan: No Access to Homicidal Means: No Identified Victim:  (n/a) History of harm to others?: No Assessment of Violence: None Noted Violent Behavior Description:  (patient is calm and cooperative ) Does patient have access to weapons?: No Criminal Charges Pending?: No Does patient have a court date: No Prior Inpatient Therapy: Prior Inpatient Therapy: No Prior Therapy Dates:  (n/a) Prior Therapy Facilty/Provider(s):  (n/a) Reason for Treatment:  (n/a) Prior Outpatient Therapy: Prior Outpatient Therapy: No Prior Therapy Dates:  (n/a) Prior Therapy Facilty/Provider(s):  (n/a) Reason for Treatment:  (n/a) Does patient have an ACCT team?: No Does patient have Intensive In-House Services?  : No Does patient have [facility] services? : No Does patient have [facility] services?: No  No current facility-administered medications for this encounter.   Current Outpatient Prescriptions  Medication Sig Dispense Refill  . ibuprofen (ADVIL,MOTRIN) 200 MG tablet Take 400 mg by mouth every 6 (six) hours as needed.    Marland Kitchen OVER THE COUNTER MEDICATION Take 1 application by mouth as needed (tooth ache). Kanka soft brush  gel    . amoxicillin (AMOXIL) 500 MG capsule Take 2 capsules (1,000 mg total) by mouth 2 (two) times daily. (Patient not taking: Reported on 12/27/2014) 40 capsule 0  . oxyCODONE-acetaminophen (PERCOCET) 5-325 MG per tablet Take 1 tablet by mouth every 4 (four) hours as needed for moderate pain. (Patient not taking: Reported on 12/27/2014) 6 tablet 0    Musculoskeletal: Strength & Muscle Tone: within normal limits Gait & Station: normal Patient leans: N/A  Psychiatric Specialty Exam: Physical Exam  ROS  Blood pressure 127/77, pulse 68, temperature 98.2 F (36.8 C), temperature source Oral, resp. rate 16, height 6' (1.829 m), weight 70.308 kg (155 lb), SpO2 100 %.Body mass index is 21.02 kg/(m^2).  General Appearance: Disheveled  Eye Sport and exercise psychologist::  Fair   Speech:  Clear and Coherent  Volume:  Normal  Mood:  Depressed  Affect:  Anxious  Thought Process:  Goal Directed  Orientation:  Full (Time, Place, and Person)  Thought Content:  Symptoms, worries  Suicidal Thoughts:  No  Homicidal Thoughts:  No  Memory:  Immediate;   Good Recent;   Good Remote;   Good  Judgement:  Impaired  Insight:  Shallow  Psychomotor Activity:  Normal  Concentration:  Good  Recall:  Good  Fund of Knowledge:Good  Language: Good  Akathisia:  No  Handed:  Right  AIMS (if indicated):     Assets:  Communication Skills Desire for Improvement Housing Intimacy Social Support Vocational/Educational  ADL's:  Intact  Cognition: WNL  Sleep:      Medical Decision Making: Review of Psycho-Social Stressors (1), Review or order clinical lab tests (1) and Established Problem, Worsening (2)  Treatment Plan Summary: Plan:  Recommend psychiatric Inpatient admission when medically cleared. Supportive therapy provided about ongoing stressors. Disposition: Recommend inpatient treatment to inpatient unit for further evaluation and treatment based on current situation of suicide attempt and previous attempt   Encarnacion Slates, PMHNP-C  12/27/2014 1:41 PM      Case discussed with me as above  Neita Garnet, MD

## 2014-12-27 NOTE — ED Notes (Signed)
Patient resting in bed at this time. Eyes closed. Even rise and fall of chest. NAD noted

## 2014-12-27 NOTE — ED Notes (Signed)
Pts white samsung galaxy SIII removed from locker and given to pt's fiance Amy"Nikki" Stone.

## 2014-12-27 NOTE — ED Notes (Signed)
Pt's mother Rudolpho Sevin notified that pt was being transferred to [facility] as requested.

## 2014-12-28 ENCOUNTER — Inpatient Hospital Stay (HOSPITAL_COMMUNITY)
Admission: AD | Admit: 2014-12-28 | Discharge: 2014-12-29 | DRG: 885 | Disposition: A | Discharge status: Home / Self Care | Payer: No Typology Code available for payment source | Source: Intra-hospital | Attending: Psychiatry | Admitting: Psychiatry

## 2014-12-28 ENCOUNTER — Encounter (HOSPITAL_COMMUNITY): Payer: Self-pay

## 2014-12-28 DIAGNOSIS — T1491 Suicide attempt: Secondary | ICD-10-CM

## 2014-12-28 DIAGNOSIS — T404X2A Poisoning by other synthetic narcotics, intentional self-harm, initial encounter: Secondary | ICD-10-CM | POA: Diagnosis not present

## 2014-12-28 DIAGNOSIS — F332 Major depressive disorder, recurrent severe without psychotic features: Principal | ICD-10-CM | POA: Diagnosis present

## 2014-12-28 DIAGNOSIS — T1491XA Suicide attempt, initial encounter: Secondary | ICD-10-CM | POA: Diagnosis present

## 2014-12-28 MED ORDER — ACETAMINOPHEN 325 MG PO TABS: 650.0000 mg | ORAL_TABLET | Freq: Four times a day (QID) | ORAL | Status: DC | PRN

## 2014-12-28 MED ORDER — TRAZODONE HCL 50 MG PO TABS: 50.0000 mg | ORAL_TABLET | Freq: Every evening | ORAL | Status: DC | PRN

## 2014-12-28 MED ORDER — ALUM & MAG HYDROXIDE-SIMETH 200-200-20 MG/5ML PO SUSP: 30.0000 mL | ORAL | Status: DC | PRN

## 2014-12-28 MED ORDER — HYDROXYZINE HCL 25 MG PO TABS: 25.0000 mg | ORAL_TABLET | Freq: Four times a day (QID) | ORAL | Status: DC | PRN

## 2014-12-28 MED ORDER — MAGNESIUM HYDROXIDE 400 MG/5ML PO SUSP: 30.0000 mL | Freq: Every day | ORAL | Status: DC | PRN

## 2014-12-28 NOTE — Progress Notes (Signed)
Patient ID: Douglas Dennis, male   DOB: [birth year], 29 y.o.   MRN: [id number] Admission note: D:Patient is a  Voluntary admission in no acute distress for attempted OD. Pt reports he took 4-6 tramadol but did not ingest the pill. Pt stated his girlfriend call the police. Pt stated his girlfriend broke up with him and wanted his attention. Pt appears to minimize hi situation reporting he Just taking up a bed and does not need to be here. Pt denies SI/HI/AVH and pain  A: Pt admitted to unit per protocol, skin assessment and belonging search done. No skin issues noted. Consent signed by pt. Pt educated on therapeutic milieu rules. Pt was introduced to milieu by nursing staff. Fall risk safety plan explained to the patient. 15 minutes checks started for safety.  R: Pt was receptive to education. Writer offered support.

## 2014-12-28 NOTE — [facility] Suicide Risk Assessment (Signed)
[facility] Admission Suicide Risk Assessment   Nursing information obtained from:  Patient Demographic factors:  Caucasian, Adolescent or young adult Current Mental Status:  Suicidal ideation indicated by patient, Intention to act on suicide plan Loss Factors:  Loss of significant relationship, Legal issues Historical Factors:  Family history of mental illness or substance abuse Risk Reduction Factors:  Responsible for children under 65 years of age, Sense of responsibility to family Total Time spent with patient: 45 minutes Principal Problem: MDD (major depressive disorder), recurrent severe, without psychosis Diagnosis:   Patient Active Problem List   Diagnosis Date Noted  . MDD (major depressive disorder), recurrent severe, without psychosis [F33.2] 12/28/2014  . Suicide attempt [T14.91] 12/27/2014     Continued Clinical Symptoms:    The "Alcohol Use Disorders Identification Test", Guidelines for Use in Primary Care, Second Edition.  World Science writer [facility]). Score between 0-7:  no or low risk or alcohol related problems. Score between 8-15:  moderate risk of alcohol related problems. Score between 16-19:  high risk of alcohol related problems. Score 20 or above:  warrants further diagnostic evaluation for alcohol dependence and treatment.   CLINICAL FACTORS:   Depression:   Hopelessness Impulsivity Recent sense of peace/wellbeing Severe Alcohol/Substance Abuse/Dependencies Unstable or Poor Therapeutic Relationship   Musculoskeletal: Strength & Muscle Tone: within normal limits Gait & Station: normal Patient leans: N/A  Psychiatric Specialty Exam: Physical Exam  ROS  Blood pressure 117/77, pulse 95, temperature 98 F (36.7 C), temperature source Oral, resp. rate 16, height 6' (1.829 m), weight 71.215 kg (157 lb).Body mass index is 21.29 kg/(m^2).  General Appearance: Casual  Eye Contact::  Good  Speech:  Clear and Coherent  Volume:  Normal  Mood:  Anxious and  Depressed  Affect:  Appropriate and Congruent  Thought Process:  Coherent and Goal Directed  Orientation:  Full (Time, Place, and Person)  Thought Content:  WDL  Suicidal Thoughts:  Yes.  with intent/plan  Homicidal Thoughts:  No  Memory:  Immediate;   Good Recent;   Good Remote;   Fair  Judgement:  Impaired  Insight:  Fair  Psychomotor Activity:  Normal  Concentration:  Good  Recall:  Good  Fund of Knowledge:Good  Language: Good  Akathisia:  Negative  Handed:  Right  AIMS (if indicated):     Assets:  Communication Skills Desire for Improvement Financial Resources/Insurance Housing Leisure Time Physical Health Resilience Social Support Talents/Skills Transportation  Sleep:  Number of Hours: 3  Cognition: WNL  ADL's:  Intact     COGNITIVE FEATURES THAT CONTRIBUTE TO RISK:  Polarized thinking    SUICIDE RISK:   Mild:  Suicidal ideation of limited frequency, intensity, duration, and specificity.  There are no identifiable plans, no associated intent, mild dysphoria and related symptoms, good self-control (both objective and subjective assessment), few other risk factors, and identifiable protective factors, including available and accessible social support.  PLAN OF CARE: Admit for safety, crisis stabilization and medication treatment for depression and suicide attempt with overdose, but reportedly split after taking into his mouth infront of his GF.   Medical Decision Making:  Review of Psycho-Social Stressors (1), Review or order clinical lab tests (1), New Problem, with no additional work-up planned (3), Review of Last Therapy Session (1), Review or order medicine tests (1), Review of Medication Regimen & Side Effects (2) and Review of New Medication or Change in Dosage (2)  I certify that inpatient services furnished can reasonably be expected to improve the patient's  condition.   Kelii Chittum,JANARDHAHA R. 12/28/2014, 12:56 PM

## 2014-12-28 NOTE — Progress Notes (Signed)
Adult Psychoeducational Group Note  Date:  12/28/2014 Time:  11:34 PM  Group Topic/Focus:  Wrap-Up Group:   The focus of this group is to help patients review their daily goal of treatment and discuss progress on daily workbooks.  Participation Level:  Active  Participation Quality:  Attentive and Sharing  Affect:  Appropriate  Cognitive:  Appropriate  Insight: Good  Engagement in Group:  Developing/Improving and Supportive  Modes of Intervention:  Discussion and Education  Additional Comments:  Patient confirmed understanding that his behaviors were a result of attention seeking.  Patient acknowledged his intentions to work to increase his self-esteem with the use of positive affirmations to assist with decreasing the need to gain attention from others.    Elmore Guise N 12/28/2014, 11:34 PM

## 2014-12-28 NOTE — Progress Notes (Signed)
D:Pt has been pleasant and cooperative attending groups and interacting with peers on the unit. Pt reports that he put pills in his mouth and spit them out being impulsive when arguing with his girlfriend. He says that he would not try to kill himself because he has two boys and a new job. He is concerned about missing more work since starting a new job recently. Pt has several teeth that are black and chipped. A:Offered support and 15 minute checks. R:Pt denies si and hi. Safety maintained on the unit.

## 2014-12-28 NOTE — BHH Counselor (Signed)
Adult Comprehensive Assessment  Patient ID: Douglas Dennis, male   DOB: January 18, 1986, 29 y.o.   MRN: [id number]  Information Source: Information source: Patient  Current Stressors:  Educational / Learning stressors: Denies stressors Employment / Job issues: Recently went to a job that is outdoors and very hot.  Has missed Thursday, Friday and today at work - only started job two weeks ago.  Needs to be at the job at 5am Monday morning. Family Relationships: Denies stressors Financial / Lack of resources (include bankruptcy): "As long as I keep this job, my finances will be fine and I can stay out of legal trouble." Housing / Lack of housing: Denies stressors Physical health (include injuries & life threatening diseases): Denies stressors Social relationships: Girlfriend did not know he had started drinking again.  Girlfriend had just moved in, and was moving out, so he was upset and wanted her attention so he acted like he took a lot of Tramadol, but spit it out. Substance abuse: Denies stressors Bereavement / Loss: Denies stressors  Living/Environment/Situation:  Living Arrangements: Alone Living conditions (as described by patient or guardian): Good, has his own room, private, part of father's property. How long has patient lived in current situation?: Girlfriend moved in for 2 weeks, moved out just before he was hospitalized.  He has been there off and on for 7 years. What is atmosphere in current home: Comfortable  Family History:  Marital status: Long term relationship Long term relationship, how long?: 4 months What types of issues is patient dealing with in the relationship?: She moved in with him, but that didn't work out because it was too much time together, so she has moved out. Does patient have children?: Yes How many children?: 2 How is patient's relationship with their children?: 6yo and 5yo - missed getting them this weekend.  Regrets this because he promised them to  always be there for them.  Childhood History:  By whom was/is the patient raised?: Mother Additional childhood history information: Father left before pt was born, so he did not meet him untl 7 years ago.  Mother married numerous times, and so stepfathers came and went. Description of patient's relationship with caregiver when they were a child: No relationship with father.  Mother - relationship was very good.  He is the baby of the family. Patient's description of current relationship with people who raised him/her: Closer to mother right now, after thinking for awhile that she didn't like or love him.  Is getting along very well with father, living at the front part of his property now. Does patient have siblings?: Yes Number of Siblings: 3 Description of patient's current relationship with siblings: He is the youngest child, and is an identical twin.  Has 2 brothers and 1 sister with same mother/father, is close to each of them, especially his twin brother.  Has a bunch of half-brothers and half-sisters he does not talk to. Did patient suffer any verbal/emotional/physical/sexual abuse as a child?: No Did patient suffer from severe childhood neglect?: No Has patient ever been sexually abused/assaulted/raped as an adolescent or adult?: No Was the patient ever a victim of a crime or a disaster?: No Witnessed domestic violence?: No Has patient been effected by domestic violence as an adult?: No  Education:  Highest grade of school patient has completed: 11th grade finished - finished 1/2 of 12th grade - has passed pre-test for GED, needs to take the actual test Currently a student?: No Learning disability?: No  Employment/Work Situation:   Employment situation: Employed Where is patient currently employed?: [employer] How long has patient been employed?: 2 weeks Patient's job has been impacted by current illness: Yes Describe how patient's job has been impacted: could lose  job by only being there 2 weeks and missing three days What is the longest time patient has a held a job?: 4 years Where was the patient employed at that time?: [employer] Has patient ever been in the [employer]?: No Has patient ever served in Buyer, retail?: No  Financial Resources:   Financial resources: Income from employment Does patient have a representative payee or guardian?: No  Alcohol/Substance Abuse:   What has been your use of drugs/alcohol within the last 12 months?: Alcohol at the beginning of the year, very little in the last 5 months until last Thursday - Marijuana on weekends now, used to smoke daily If attempted suicide, did drugs/alcohol play a role in this?: Yes Alcohol/Substance Abuse Treatment Hx: Denies past history Has alcohol/substance abuse ever caused legal problems?: No  Social Support System:   Conservation officer, nature Support System: Good Describe Community Support System: Family is very positive Type of faith/religion: Christianity How does patient's faith help to cope with current illness?: Prays, follows the Bible, very important to him  Leisure/Recreation:   Leisure and Hobbies: Play video games, exercise for stress relief  Strengths/Needs:   What things does the patient do well?: Any kind of sports, communication  In what areas does patient struggle / problems for patient: Stress relief  Discharge Plan:   Does patient have access to transportation?: Yes Will patient be returning to same living situation after discharge?: Yes Currently receiving community mental health services: No If no, would patient like referral for services when discharged?: No ([facility]) Does patient have financial barriers related to discharge medications?: Yes Patient description of barriers related to discharge medications: Limited income and no insurance  Summary/Recommendations:   Summary and Recommendations (to be completed by the evaluator): Keison is a 29yo  male hospitalized after he pretended to take some Tramadol with beer in what he reports was a ploy for attention from his girlfriend who was moving out.  He lives in [place], has no psychiatrist or therapist, is "willing to take any class you want" and is adamant he needs to discharge to get back to work by Monday morning at [facility] at a new job.  The patient would benefit from safety monitoring, medication evaluation, psychoeducation, group therapy, and discharge planning to link with ongoing resources. The patient does not smoke or need referral to [facility] for smoking cessation.  The Discharge Process and Patient Involvement form was reviewed with patient at the end of the Psychosocial Assessment, and the patient confirmed understanding and signed that document, which was placed in the paper chart. Suicide Prevention Education was reviewed thoroughly, and a brochure left with patient.  The patient signed consent for SPE to be provided to girlfriend Sue Lush [phone].  Sarina Ser. 12/28/2014

## 2014-12-28 NOTE — Progress Notes (Signed)
[facility] Group Notes:  (Nursing/MHT/Case Management/Adjunct)  Date:  12/28/2014  Time:  1000  Type of Therapy:  Nurse Education  Participation Level:  Active  Participation Quality:  Appropriate and Supportive  Affect:  Appropriate  Cognitive:  Alert, Appropriate and Oriented  Insight:  Appropriate  Engagement in Group:  Engaged  Modes of Intervention:  Clarification, Discussion, Education, Socialization and Support  Summary of Progress/Problems:Pt lists positive things about himself as his since of humor, having two boys, his faith and putting other first.    Beatrix Shipper 12/28/2014, 3:18 PM

## 2014-12-28 NOTE — B[facility] Group Notes (Signed)
[facility] Group Notes:  (Clinical Social Work)  12/28/2014     1:15-2:15PM  Summary of Progress/Problems:   The main focus of today's process group was to contemplate healthy coping skills and how to learn more, not just in this setting, but after leaving the hospital.  Patients listed needs on the whiteboard and unhealthy coping techniques often used to fill needs.  Motivational Interviewing and the whiteboard were utilized to help patients explore in depth the perceived benefits and costs of unhealthy coping techniques, as well as the  benefits and costs of replacing that with a healthy coping skills.  Pt related well to others and made appropriate, supportive comments.  Type of Therapy:  Group Therapy - Process   Participation Level:  Active  Participation Quality:  Appropriate, Attentive, Sharing and Supportive  Affect:  Blunted and Tearful  Cognitive:  Alert, Appropriate and Oriented  Insight:  Engaged  Engagement in Therapy:  Engaged  Modes of Intervention:  Education, Motivational Interviewing  Ambrose Mantle, LCSW 12/28/2014, 4:37 PM

## 2014-12-28 NOTE — Plan of Care (Signed)
Problem: Alteration in mood Goal: LTG-Patient reports reduction in suicidal thoughts (Patient reports reduction in suicidal thoughts and is able to verbalize a safety plan for whenever patient is feeling suicidal)  Outcome: Progressing Pt denies si thoughts.       

## 2014-12-28 NOTE — H&P (Signed)
Psychiatric Admission Assessment Adult  Patient Identification: Douglas Dennis MRN:  [id number] Date of Evaluation:  12/29/2014 Chief Complaint:  "I did not handle an argument with my girlfriend well and just wanted to get her attention."  Principal Diagnosis: MDD (major depressive disorder), recurrent severe, without psychosis Diagnosis:   Patient Active Problem List   Diagnosis Date Noted  . MDD (major depressive disorder), recurrent severe, without psychosis [F33.2] 12/28/2014  . Suicide attempt [T14.91] 12/27/2014   History of Present Illness::   Douglas Dennis is 29 year old who presented to the [facility] after taking ultram. He has a history of depression but is not receiving any current treatment. The patient denies a history of suicide attempt. However, notes in epic indicate that he has a previous attempt by walking on the railroad tracks. The patient is currently requesting to be discharged from the hospital. He reports having job that he needs to be there in the morning tomorrow or will lose the job. The patient maintains that he did not attempt suicide and is continues to be concerned over losing a new job. Patient states during psychiatric assessment "I would never hurt myself. I love myself and my family. I just wanted to get her attention and did not mean myself any harm. I will do anything to be released. Go to any counseling that you want me to attend. I really do not feel that I have been depressed or anxious. I get up in the morning and work hard for my family. I really never tried before. After I got divorced before I did walk on some abandoned railroad tracks to clear my head. I had no suicidal intentions. If I am not at work at 5 am on Monday morning then I will lose that job. I have pills that I have to pay. I am not suicidal and I really regret my actions." Patient cooperative with assessment and did not appear depressed at time of assessment. Denies any psychotic symptoms. Is  not currently receiving any outpatient psychiatric care.   Elements:  Location:  depression. Quality:  Overdose on ultram. Severity:  Severe. Timing:  Acute . Duration:  One day. Context:  became upset after girlfriend started to move out. Associated Signs/Symptoms: Depression Symptoms:  Denies (Hypo) Manic Symptoms:  Denies Anxiety Symptoms:  Denies Psychotic Symptoms:  Denies PTSD Symptoms: Denies Total Time spent with patient: 45 minutes  Past Medical History: History reviewed. No pertinent past medical history. History reviewed. No pertinent past surgical history. Family History: History reviewed. No pertinent family history. Social History:  History  Alcohol Use No    Comment: pt denies     History  Drug Use No    Comment: pt denies    Social History   Social History  . Marital Status: Legally Separated    Spouse Name: N/A  . Number of Children: N/A  . Years of Education: N/A   Social History Main Topics  . Smoking status: Never Smoker   . Smokeless tobacco: None  . Alcohol Use: No     Comment: pt denies  . Drug Use: No     Comment: pt denies  . Sexual Activity: Not Asked   Other Topics Concern  . None   Social History Narrative   Additional Social History:                          Musculoskeletal: Strength & Muscle Tone: within normal limits Gait &  Station: normal Patient leans: N/A  Psychiatric Specialty Exam: Physical Exam  Review of Systems  Constitutional: Negative.   HENT: Negative.   Eyes: Negative.   Respiratory: Negative.   Cardiovascular: Negative.   Gastrointestinal: Negative.   Genitourinary: Negative.   Musculoskeletal: Negative.   Skin: Negative.   Neurological: Negative.   Endo/Heme/Allergies: Negative.   Psychiatric/Behavioral: Negative for depression, suicidal ideas, hallucinations, memory loss and substance abuse. The patient is nervous/anxious. The patient does not have insomnia.     Blood pressure 117/77,  pulse 95, temperature 98 F (36.7 C), temperature source Oral, resp. rate 16, height 6' (1.829 m), weight 71.215 kg (157 lb).Body mass index is 21.29 kg/(m^2).  General Appearance: Casual  Eye Contact::  Good  Speech:  Clear and Coherent  Volume:  Normal  Mood:  Anxious and Depressed  Affect:  Appropriate  Thought Process:  Coherent and Goal Directed  Orientation:  Full (Time, Place, and Person)  Thought Content:  WDL  Suicidal Thoughts:  No  Homicidal Thoughts:  No  Memory:  Immediate;   Good Recent;   Good Remote;   Fair  Judgement:  Impaired  Insight:  Fair  Psychomotor Activity:  Normal  Concentration:  Good  Recall:  Good  Fund of Knowledge:Good  Language: Good  Akathisia:  Negative  Handed:  Right  AIMS (if indicated):     Assets:  Communication Skills Desire for Improvement Financial Resources/Insurance Housing Intimacy Leisure Time Physical Health Resilience Social Support Talents/Skills Vocational/Educational  ADL's:  Intact  Cognition: WNL  Sleep:  Number of Hours: 5.5   Risk to Self: Is patient at risk for suicide?: Yes What has been your use of drugs/alcohol within the last 12 months?: Alcohol at the beginning of the year, very little in the last 5 months until last Thursday - Marijuana on weekends now, used to smoke daily Risk to Others:   Prior Inpatient Therapy:   Prior Outpatient Therapy:    Alcohol Screening:    Allergies:   Allergies  Allergen Reactions  . Tramadol     itching   Lab Results: No results found for this or any previous visit (from the past 48 hour(s)). Current Medications: Current Facility-Administered Medications  Medication Dose Route Frequency Provider Last Rate Last Dose  . acetaminophen (TYLENOL) tablet 650 mg  650 mg Oral Q6H PRN Niel Hummer, NP      . alum & mag hydroxide-simeth (MAALOX/MYLANTA) 200-200-20 MG/5ML suspension 30 mL  30 mL Oral Q4H PRN Niel Hummer, NP      . hydrOXYzine (ATARAX/VISTARIL) tablet 25 mg   25 mg Oral Q6H PRN Niel Hummer, NP      . magnesium hydroxide (MILK OF MAGNESIA) suspension 30 mL  30 mL Oral Daily PRN Niel Hummer, NP      . traZODone (DESYREL) tablet 50 mg  50 mg Oral QHS PRN Niel Hummer, NP       PTA Medications: Prescriptions prior to admission  Medication Sig Dispense Refill Last Dose  . amoxicillin (AMOXIL) 500 MG capsule Take 2 capsules (1,000 mg total) by mouth 2 (two) times daily. (Patient not taking: Reported on 12/27/2014) 40 capsule 0 Unknown at Unknown time  . ibuprofen (ADVIL,MOTRIN) 200 MG tablet Take 400 mg by mouth every 6 (six) hours as needed.   Unknown at Unknown time  . OVER THE COUNTER MEDICATION Take 1 application by mouth as needed (tooth ache). Kanka soft brush gel   Unknown at Unknown time  .  oxyCODONE-acetaminophen (PERCOCET) 5-325 MG per tablet Take 1 tablet by mouth every 4 (four) hours as needed for moderate pain. (Patient not taking: Reported on 12/27/2014) 6 tablet 0 Unknown at Unknown time    Previous Psychotropic Medications: No   Substance Abuse History in the last 12 months:  Yes.   Reports occasional use of marijuana and his UDS is positive for marijuana    Consequences of Substance Abuse: Negative  Results for orders placed or performed during the hospital encounter of 12/26/14 (from the past 72 hour(s))  CBC with Differential     Status: Abnormal   Collection Time: 12/26/14  1:00 PM  Result Value Ref Range   WBC 4.8 4.0 - 10.5 K/uL   RBC 4.25 4.22 - 5.81 MIL/uL   Hemoglobin 13.3 13.0 - 17.0 g/dL   HCT 38.9 (L) 39.0 - 52.0 %   MCV 91.5 78.0 - 100.0 fL   MCH 31.3 26.0 - 34.0 pg   MCHC 34.2 30.0 - 36.0 g/dL   RDW 12.1 11.5 - 15.5 %   Platelets 214 150 - 400 K/uL   Neutrophils Relative % 56 43 - 77 %   Neutro Abs 2.7 1.7 - 7.7 K/uL   Lymphocytes Relative 37 12 - 46 %   Lymphs Abs 1.8 0.7 - 4.0 K/uL   Monocytes Relative 6 3 - 12 %   Monocytes Absolute 0.3 0.1 - 1.0 K/uL   Eosinophils Relative 1 0 - 5 %   Eosinophils  Absolute 0.1 0.0 - 0.7 K/uL   Basophils Relative 0 0 - 1 %   Basophils Absolute 0.0 0.0 - 0.1 K/uL  Ethanol     Status: Abnormal   Collection Time: 12/26/14  1:00 PM  Result Value Ref Range   Alcohol, Ethyl (B) 18 (H) <5 mg/dL    Comment:        LOWEST DETECTABLE LIMIT FOR SERUM ALCOHOL IS 5 mg/dL FOR MEDICAL PURPOSES ONLY   Comprehensive metabolic panel     Status: Abnormal   Collection Time: 12/26/14  1:00 PM  Result Value Ref Range   Sodium 141 135 - 145 mmol/L   Potassium 3.9 3.5 - 5.1 mmol/L   Chloride 108 101 - 111 mmol/L   CO2 28 22 - 32 mmol/L   Glucose, Bld 79 65 - 99 mg/dL   BUN 10 6 - 20 mg/dL   Creatinine, Ser 0.88 0.61 - 1.24 mg/dL   Calcium 8.8 (L) 8.9 - 10.3 mg/dL   Total Protein 6.6 6.5 - 8.1 g/dL   Albumin 3.8 3.5 - 5.0 g/dL   AST 25 15 - 41 U/L   ALT 19 17 - 63 U/L   Alkaline Phosphatase 50 38 - 126 U/L   Total Bilirubin 0.5 0.3 - 1.2 mg/dL   GFR calc non Af Amer >60 >60 mL/min   GFR calc Af Amer >60 >60 mL/min    Comment: (NOTE) The eGFR has been calculated using the CKD EPI equation. This calculation has not been validated in all clinical situations. eGFR's persistently <60 mL/min signify possible Chronic Kidney Disease.    Anion gap 5 5 - 15  Troponin I     Status: None   Collection Time: 12/26/14  1:00 PM  Result Value Ref Range   Troponin I <0.03 <0.031 ng/mL    Comment:        NO INDICATION OF MYOCARDIAL INJURY.   Salicylate level     Status: None   Collection Time: 12/26/14  1:00  PM  Result Value Ref Range   Salicylate Lvl <9.6 2.8 - 30.0 mg/dL  CK     Status: None   Collection Time: 12/26/14  1:00 PM  Result Value Ref Range   Total CK 241 49 - 397 U/L  Acetaminophen level     Status: Abnormal   Collection Time: 12/26/14  1:00 PM  Result Value Ref Range   Acetaminophen (Tylenol), Serum <10 (L) 10 - 30 ug/mL    Comment:        THERAPEUTIC CONCENTRATIONS VARY SIGNIFICANTLY. A RANGE OF 10-30 ug/mL MAY BE AN EFFECTIVE CONCENTRATION  FOR MANY PATIENTS. HOWEVER, SOME ARE BEST TREATED AT CONCENTRATIONS OUTSIDE THIS RANGE. ACETAMINOPHEN CONCENTRATIONS >150 ug/mL AT 4 HOURS AFTER INGESTION AND >50 ug/mL AT 12 HOURS AFTER INGESTION ARE OFTEN ASSOCIATED WITH TOXIC REACTIONS.   Urine rapid drug screen (hosp performed)     Status: Abnormal   Collection Time: 12/26/14  2:19 PM  Result Value Ref Range   Opiates NONE DETECTED NONE DETECTED   Cocaine NONE DETECTED NONE DETECTED   Benzodiazepines NONE DETECTED NONE DETECTED   Amphetamines NONE DETECTED NONE DETECTED   Tetrahydrocannabinol POSITIVE (A) NONE DETECTED   Barbiturates NONE DETECTED NONE DETECTED    Comment:        DRUG SCREEN FOR MEDICAL PURPOSES ONLY.  IF CONFIRMATION IS NEEDED FOR ANY PURPOSE, NOTIFY LAB WITHIN 5 DAYS.        LOWEST DETECTABLE LIMITS FOR URINE DRUG SCREEN Drug Class       Cutoff (ng/mL) Amphetamine      1000 Barbiturate      200 Benzodiazepine   222 Tricyclics       979 Opiates          300 Cocaine          300 THC              50   Urinalysis, Routine w reflex microscopic     Status: None   Collection Time: 12/26/14  2:19 PM  Result Value Ref Range   Color, Urine YELLOW YELLOW   APPearance CLEAR CLEAR   Specific Gravity, Urine 1.010 1.005 - 1.030   pH 6.5 5.0 - 8.0   Glucose, UA NEGATIVE NEGATIVE mg/dL   Hgb urine dipstick NEGATIVE NEGATIVE   Bilirubin Urine NEGATIVE NEGATIVE   Ketones, ur NEGATIVE NEGATIVE mg/dL   Protein, ur NEGATIVE NEGATIVE mg/dL   Urobilinogen, UA 0.2 0.0 - 1.0 mg/dL   Nitrite NEGATIVE NEGATIVE   Leukocytes, UA NEGATIVE NEGATIVE    Comment: MICROSCOPIC NOT DONE ON URINES WITH NEGATIVE PROTEIN, BLOOD, LEUKOCYTES, NITRITE, OR GLUCOSE <1000 mg/dL.    Observation Level/Precautions:  15 minute checks  Laboratory:  CBC Chemistry Profile UDS  Psychotherapy:  Individual and Group Therapy  Medications:  Declines trial of medication for anxiety or depression at this time  Consultations:  As needed   Discharge Concerns:  Safety and Stability   Estimated LOS: 2-3 days  Other:  Increase collateral information from family    Psychological Evaluations: Yes   Treatment Plan Summary: Daily contact with patient to assess and evaluate symptoms and progress in treatment and Medication management  Medical Decision Making:  New problem, with additional work up planned, Review of Psycho-Social Stressors (1), Review or order clinical lab tests (1), Review of Medication Regimen & Side Effects (2) and Review of New Medication or Change in Dosage (2)  I certify that inpatient services furnished can reasonably be expected to improve the patient's  condition.   Elmarie Shiley, NP-C 9/11/20169:14 AM  Patient seen face-to-face for the psychiatric evaluation, case discussed with the physician extender, formulated treatment plan, completed admission suicide risk assessment and certify that patient need acute psychiatric hospitalization for crisis evaluation, safety monitoring and medication management and therapeutic interventions. Patient minimized the need of medication management intervention and more focused on going back to work and improving his relationship with girlfriend. Reviewed the information documented and agree with the treatment plan.   Myleka Moncure,JANARDHAHA R. 12/29/2014 4:56 PM

## 2014-12-28 NOTE — Tx Team (Signed)
Initial Interdisciplinary Treatment Plan   PATIENT STRESSORS: Financial difficulties Loss of significant relationdhif   PATIENT STRENGTHS: Active sense of humor Capable of independent living Special hobby/interest Supportive family/friends   PROBLEM LIST: Problem List/Patient Goals Date to be addressed Date deferred Reason deferred Estimated date of resolution  depression 12/28/2014     Risk for suicide 12/28/2014     Attempted OD 12/28/2014     "I need help coping with my feeling" 12/28/2014                                    DISCHARGE CRITERIA:  Ability to meet basic life and health needs Improved stabilization in mood, thinking, and/or behavior Medical problems require only outpatient monitoring Verbal commitment to aftercare and medication compliance  PRELIMINARY DISCHARGE PLAN: Attend 12-step recovery group Participate in family therapy  PATIENT/FAMIILY INVOLVEMENT: This treatment plan has been presented to and reviewed with the patient, Douglas D RobertsonThe patient and family have been given the opportunity to ask questions and make suggestions.  JEHU-APPIAH, Whitley Patchen K 12/28/2014, 4:01 AM

## 2014-12-28 NOTE — ED Notes (Signed)
Patient verbalizes understanding of discharge to [facility]

## 2014-12-29 NOTE — Progress Notes (Signed)
  [facility] Adult Case Management Discharge Plan :  Will you be returning to the same living situation after discharge:  Yes,  his own home At discharge, do you have transportation home?: Yes,  girlfriend to pick up Do you have the ability to pay for your medications: Yes,  but not on medications  Release of information consent forms completed and in the chart;  Patient's signature needed at discharge.  Patient to Follow up at: Follow-up Information    Go to [facility].   Why:  As needed, go for an assessment to get started in individual and/or group therapy services, if decide to pursue this.    Contact information:   [facility]  [address] [place]  [address] Telephone:  [phone]      Patient denies SI/HI: Yes,  see doctor d/c note    Safety Planning and Suicide Prevention discussed: Yes,  with patient anda with his girlfriend     Has patient been referred to the Quitline?: N/A patient is not a smoker  Sarina Ser 12/29/2014, 3:39 PM

## 2014-12-29 NOTE — Progress Notes (Signed)
  D: Pt stated, "I realize it was a stupid way to get attention." Stated his gf was moving out and he didn't want her to go. Pt states he needs to be discharged before 1700 on Monday or he will lose his job.   A:  Support and encouragement was offered. 15 min checks continued for safety.  R: Pt remains safe.

## 2014-12-29 NOTE — [facility] Suicide Risk Assessment (Signed)
[facility] INPATIENT:  Family/Significant Other Suicide Prevention Education  Suicide Prevention Education:  Contact Attempts: [name] [phone] has been identified by the patient as the family member/significant other with whom the patient will be residing, and identified as the person(s) who will aid the patient in the event of a mental health crisis.  With written consent from the patient, two attempts were made to provide suicide prevention education, prior to and/or following the patient's discharge.  We were unsuccessful in providing suicide prevention education.  A suicide education pamphlet was given to the patient to share with family/significant other.  Date and time of first attempt:  12/29/14 at 12:56pm Date and time of second attempt:12/29/14 at 2:44pm  Douglas Dennis 12/29/2014, 12:56 PM

## 2014-12-29 NOTE — [facility] Suicide Risk Assessment (Signed)
[facility] Discharge Suicide Risk Assessment   Demographic Factors:  Male, Adolescent or young adult and Caucasian  Total Time spent with patient: 30 minutes  Musculoskeletal: Strength & Muscle Tone: within normal limits Gait & Station: normal Patient leans: N/A  Psychiatric Specialty Exam: Physical Exam  ROS  Blood pressure 113/75, pulse 84, temperature 98 F (36.7 C), temperature source Oral, resp. rate 20, height 6' (1.829 m), weight 71.215 kg (157 lb).Body mass index is 21.29 kg/(m^2).  General Appearance: Casual  Eye Contact::  Good  Speech:  Clear and Coherent409  Volume:  Normal  Mood:  Euthymic  Affect:  Appropriate and Congruent  Thought Process:  Coherent, Goal Directed, Intact and Logical  Orientation:  Full (Time, Place, and Person)  Thought Content:  WDL  Suicidal Thoughts:  No  Homicidal Thoughts:  No  Memory:  Immediate;   Good Recent;   Good  Judgement:  Intact  Insight:  Good  Psychomotor Activity:  Normal  Concentration:  Good  Recall:  Good  Fund of Knowledge:Good  Language: Good  Akathisia:  Negative  Handed:  Right  AIMS (if indicated):     Assets:  Communication Skills Desire for Improvement Financial Resources/Insurance Housing Intimacy Leisure Time Physical Health Resilience Social Support Talents/Skills Transportation Vocational/Educational  Sleep:  Number of Hours: 5.5  Cognition: WNL  ADL's:  Intact      Has this patient used any form of tobacco in the last 30 days? (Cigarettes, Smokeless Tobacco, Cigars, and/or Pipes) No  Mental Status Per Nursing Assessment::   On Admission:  Suicidal ideation indicated by patient, Intention to act on suicide plan  Current Mental Status by Physician: patient denied current suicide or homicide ideation, intention or plans. He has no evidence of psychosis. Stated that he has taken medication and spit them out which is a attention seeking behavior which regrets now. He is anxious about lossing job and  income now.   Loss Factors: NA  Historical Factors: Impulsivity  Risk Reduction Factors:   Sense of responsibility to family, Religious beliefs about death, Employed, Living with another person, especially a relative, Positive social support, Positive therapeutic relationship and Positive coping skills or problem solving skills  Continued Clinical Symptoms:  Depression:   Recent sense of peace/wellbeing Previous Psychiatric Diagnoses and Treatments  Cognitive Features That Contribute To Risk:  Polarized thinking    Suicide Risk:  Minimal: No identifiable suicidal ideation.  Patients presenting with no risk factors but with morbid ruminations; may be classified as minimal risk based on the severity of the depressive symptoms  Principal Problem: MDD (major depressive disorder), recurrent severe, without psychosis Discharge Diagnoses:  Patient Active Problem List   Diagnosis Date Noted  . MDD (major depressive disorder), recurrent severe, without psychosis [F33.2] 12/28/2014  . Suicide attempt [T14.91] 12/27/2014      Plan Of Care/Follow-up recommendations:  Activity:  As tolerated Diet:  Regular  Is patient on multiple antipsychotic therapies at discharge:  No   Has Patient had three or more failed trials of antipsychotic monotherapy by history:  No  Recommended Plan for Multiple Antipsychotic Therapies: NA    Dewel Lotter,JANARDHAHA R. 12/29/2014, 12:18 PM

## 2014-12-29 NOTE — [facility] Group Notes (Signed)
[facility] Group Notes:  (Clinical Social Work)  12/29/2014  1:15-2:30PM  Summary of Progress/Problems:   With today being the anniversary of 9/11, such a significant event in the history of many Americans, group started with an opportunity to express and process feelings surrounding this.  After this was done, the remaining time was spent   1)  discussing the importance of adding supports and how this can help in the future  2)  identify the patient's current unhealthy supports and plan how to handle them  3)  Identify the patient's current healthy supports and brainstorm what could be added including someone for accountability, a counselor, doctor, therapy groups, 12-step groups, support groups, volunteer work, Physicist, medical, planned activities and more.  The patient expressed full comprehension of the concepts presented, and agreed that there is a need to add more supports as well as add accountability.  The patient stated his mother is a significantly healthy support although he cannot always accept her support the way she gives it.  He stated what is important to him is to keep having hope.  Type of Therapy:  Process Group with Motivational Interviewing  Participation Level:  Active  Participation Quality:  Appropriate, Attentive and Sharing  Affect:  Appropriate  Cognitive:  Alert, Appropriate and Oriented  Insight:  Engaged  Engagement in Therapy:  Engaged  Modes of Intervention:   Education, Support and Processing, Activity  Ambrose Mantle, LCSW 12/29/2014

## 2014-12-29 NOTE — [facility] Group Notes (Signed)

## 2014-12-29 NOTE — Progress Notes (Signed)
D: Pt presents anxious on approach. Pt verbalized that he is ready to d/c'd home, in order to return to work. Pt denies suicidal thoughts. Pt denies depression and anxiety. Pt reported that he faked being suicidal in an attempt to get his girlfriend not to leave him. Pt verbalized that he is aware of how serious suicide attempts are and regrets what he did. Pt reported that he have worked things out with his girlfriend and that she have come to visit him here in the hosp. A: Medications administered as ordered per MD. Verbal support given. Pt encouraged to attend groups. 15 minute checks performed for safety. R: Pt receptive to tx.

## 2014-12-29 NOTE — [facility] Suicide Risk Assessment (Signed)
[facility] INPATIENT:  Family/Significant Other Suicide Prevention Education  Suicide Prevention Education:  Education Completed; [name] has been identified by the patient as the family member/significant other with whom the patient will be residing, and identified as the person(s) who will aid the patient in the event of a mental health crisis (suicidal ideations/suicide attempt).  With written consent from the patient, the family member/significant other has been provided the following suicide prevention education, prior to the and/or following the discharge of the patient.  The suicide prevention education provided includes the following:  Suicide risk factors  Suicide prevention and interventions  National Suicide Hotline telephone number  [facility] assessment telephone number  [facility] Emergency Assistance 911  [facility] and/or Residential Mobile Crisis Unit telephone number  Request made of family/significant other to:  Remove weapons (e.g., guns, rifles, knives), all items previously/currently identified as safety concern.    Remove drugs/medications (over-the-counter, prescriptions, illicit drugs), all items previously/currently identified as a safety concern.  The family member/significant other verbalizes understanding of the suicide prevention education information provided.  The family member/significant other agrees to remove the items of safety concern listed above.  No weapons in the home per girlfriend, and no concerns about [facility] discharging today.  Sarina Ser 12/29/2014, 3:39 PM

## 2014-12-29 NOTE — Discharge Summary (Signed)
Physician Discharge Summary Note  Patient:  Douglas Dennis is an 29 y.o., male MRN:  [id number] DOB:  December 23, 1985 Patient phone:  [phone] (home)  Patient address:   [address] [place] [address],  Total Time spent with patient: 30 minutes  Date of Admission:  12/28/2014 Date of Discharge: 12/29/2014  Reason for Admission:  Suicide attempt via overdose  Principal Problem: MDD (major depressive disorder), recurrent severe, without psychosis Discharge Diagnoses: Patient Active Problem List   Diagnosis Date Noted  . MDD (major depressive disorder), recurrent severe, without psychosis [F33.2] 12/28/2014  . Suicide attempt [T14.91] 12/27/2014    Musculoskeletal: Strength & Muscle Tone: within normal limits Gait & Station: normal Patient leans: N/A  Psychiatric Specialty Exam: Physical Exam  Psychiatric: He has a normal mood and affect. His speech is normal and behavior is normal. Judgment and thought content normal. Cognition and memory are normal.    Review of Systems  Constitutional: Negative.   HENT: Negative.   Eyes: Negative.   Respiratory: Negative.   Cardiovascular: Negative.   Gastrointestinal: Negative.   Genitourinary: Negative.   Musculoskeletal: Negative.   Skin: Negative.   Neurological: Negative.   Endo/Heme/Allergies: Negative.   Psychiatric/Behavioral: Negative for depression, suicidal ideas, hallucinations, memory loss and substance abuse. The patient is not nervous/anxious and does not have insomnia.     Blood pressure 113/75, pulse 84, temperature 98 F (36.7 C), temperature source Oral, resp. rate 20, height 6' (1.829 m), weight 71.215 kg (157 lb).Body mass index is 21.29 kg/(m^2).  See Physician SRA        Has this patient used any form of tobacco in the last 30 days? (Cigarettes, Smokeless Tobacco, Cigars, and/or Pipes) No  Past Medical History: History reviewed. No pertinent past medical history. History reviewed. No pertinent past  surgical history. Family History: History reviewed. No pertinent family history. Social History:  History  Alcohol Use No    Comment: pt denies     History  Drug Use No    Comment: pt denies    Social History   Social History  . Marital Status: Legally Separated    Spouse Name: N/A  . Number of Children: N/A  . Years of Education: N/A   Social History Main Topics  . Smoking status: Never Smoker   . Smokeless tobacco: None  . Alcohol Use: No     Comment: pt denies  . Drug Use: No     Comment: pt denies  . Sexual Activity: Not Asked   Other Topics Concern  . None   Social History Narrative   Risk to Self: Is patient at risk for suicide?: Yes What has been your use of drugs/alcohol within the last 12 months?: Alcohol at the beginning of the year, very little in the last 5 months until last Thursday - Marijuana on weekends now, used to smoke daily Risk to Others:   Prior Inpatient Therapy:   Prior Outpatient Therapy:    Level of Care:  OP  Hospital Course:   Douglas Dennis is 29 year old who presented to the [facility] after taking ultram. He has a history of depression but is not receiving any current treatment. The patient denies a history of suicide attempt. However, notes in epic indicate that he has a previous attempt by walking on the railroad tracks. The patient is currently requesting to be discharged from the hospital. He reports having job that he needs to be there in the morning tomorrow or will lose the  job. The patient maintains that he did not attempt suicide and is continues to be concerned over losing a new job. Patient states during psychiatric assessment "I would never hurt myself. I love myself and my family. I just wanted to get her attention and did not mean myself any harm. I will do anything to be released. Go to any counseling that you want me to attend. I really do not feel that I have been depressed or anxious. I get up in the morning and work hard for my  family. I really never tried before. After I got divorced before I did walk on some abandoned railroad tracks to clear my head. I had no suicidal intentions. If I am not at work at 5 am on Monday morning then I will lose that job. I have pills that I have to pay. I am not suicidal and I really regret my actions." Patient cooperative with assessment and did not appear depressed at time of assessment. Denies any psychotic symptoms. Is not currently receiving any outpatient psychiatric care.   The patient was admitted to the 400 hall for further evaluation. He continued to deny suicidal thoughts or that he had attempted suicide before. Patient was active on the unit and did no show symptoms of acute depression. He requested discharge as soon as possible in order to keep his new job. Patient cited that losing the job would be very stressful to him. After meeting with the treatment team the patient was found safe to discharge, which his girlfriend also agreed with decision. Patient declined to be started on any psychotropic medications during his admission. He was encouraged to follow up with outpatient services. At time of discharge the patient denied any suicidal thoughts.   Consults:  psychiatry  Significant Diagnostic Studies:  Chemistry profile, CBC, UA, UDS positive for marijuana   Discharge Vitals:   Blood pressure 113/75, pulse 84, temperature 98 F (36.7 C), temperature source Oral, resp. rate 20, height 6' (1.829 m), weight 71.215 kg (157 lb). Body mass index is 21.29 kg/(m^2). Lab Results:   No results found for this or any previous visit (from the past 72 hour(s)).  Physical Findings: AIMS: Facial and Oral Movements Muscles of Facial Expression: None, normal Lips and Perioral Area: None, normal Jaw: None, normal Tongue: None, normal,Extremity Movements Upper (arms, wrists, hands, fingers): None, normal Lower (legs, knees, ankles, toes): None, normal, Trunk Movements Neck, shoulders,  hips: None, normal, Overall Severity Severity of abnormal movements (highest score from questions above): None, normal Incapacitation due to abnormal movements: None, normal Patient's awareness of abnormal movements (rate only patient's report): No Awareness, Dental Status Current problems with teeth and/or dentures?: No Does patient usually wear dentures?: No  CIWA:    COWS:      See Psychiatric Specialty Exam and Suicide Risk Assessment completed by Attending Physician prior to discharge.  Discharge destination:  Home  Is patient on multiple antipsychotic therapies at discharge:  No   Has Patient had three or more failed trials of antipsychotic monotherapy by history:  No    Recommended Plan for Multiple Antipsychotic Therapies: NA     Medication List    STOP taking these medications        amoxicillin 500 MG capsule  Commonly known as:  AMOXIL     ibuprofen 200 MG tablet  Commonly known as:  ADVIL,MOTRIN     OVER THE COUNTER MEDICATION     oxyCODONE-acetaminophen 5-325 MG per tablet  Commonly known as:  PERCOCET       Follow-up Information    Go to [facility].   Why:  As needed, go for an assessment to get started in individual and/or group therapy services, if decide to pursue this.    Contact information:   [facility]  [address] [place]  [address] Telephone:  [phone]      Follow-up recommendations:    Activity: As tolerated Diet: Regular  Comments:   Take all your medications as prescribed by your mental healthcare provider.  Report any adverse effects and or reactions from your medicines to your outpatient provider promptly.  Patient is instructed and cautioned to not engage in alcohol and or illegal drug use while on prescription medicines.  In the event of worsening symptoms, patient is instructed to call the crisis hotline, 911 and or go to the nearest ED for appropriate evaluation and treatment of symptoms.   Follow-up with your primary care provider for your other medical issues, concerns and or health care needs.   Total Discharge Time: Greater than 30 minutes  Signed: Fransisca Kaufmann, NP-C 12/30/2014, 8:57 AM   Patient seen face to face for this evaluation, chart reviewed, case discussed with physician extender, completed suicide risk assessment and formulated safe disposition treatment plan. Reviewed the information documented by psychiatric nurse practitioner and agree with the treatment plan.Darrol Jump R. 12/30/2014 11:16 AM

## 2014-12-29 NOTE — Progress Notes (Signed)
Discharge note: Pt received both written and verbal discharge instructions. Pt verbalized understanding of discharge instructions. Pt agreed to f/u appt. Pt received letter of hospitalization for work, information on alcoholics anonymous and belongings. Pt safely left [facility].

## 2015-01-01 ENCOUNTER — Emergency Department (HOSPITAL_COMMUNITY)
Admission: EM | Admit: 2015-01-01 | Discharge: 2015-01-02 | Disposition: A | Discharge status: Home / Self Care | Payer: Self-pay | Attending: Emergency Medicine | Admitting: Emergency Medicine

## 2015-01-01 ENCOUNTER — Encounter (HOSPITAL_COMMUNITY): Payer: Self-pay | Admitting: *Deleted

## 2015-01-01 DIAGNOSIS — Y9289 Other specified places as the place of occurrence of the external cause: Secondary | ICD-10-CM | POA: Insufficient documentation

## 2015-01-01 DIAGNOSIS — Y998 Other external cause status: Secondary | ICD-10-CM | POA: Insufficient documentation

## 2015-01-01 DIAGNOSIS — X30XXXA Exposure to excessive natural heat, initial encounter: Secondary | ICD-10-CM | POA: Insufficient documentation

## 2015-01-01 DIAGNOSIS — E860 Dehydration: Secondary | ICD-10-CM | POA: Insufficient documentation

## 2015-01-01 DIAGNOSIS — R011 Cardiac murmur, unspecified: Secondary | ICD-10-CM | POA: Insufficient documentation

## 2015-01-01 DIAGNOSIS — R0602 Shortness of breath: Secondary | ICD-10-CM | POA: Insufficient documentation

## 2015-01-01 DIAGNOSIS — R252 Cramp and spasm: Secondary | ICD-10-CM | POA: Insufficient documentation

## 2015-01-01 DIAGNOSIS — Y9389 Activity, other specified: Secondary | ICD-10-CM | POA: Insufficient documentation

## 2015-01-01 DIAGNOSIS — T675XXA Heat exhaustion, unspecified, initial encounter: Secondary | ICD-10-CM | POA: Insufficient documentation

## 2015-01-01 DIAGNOSIS — M791 Myalgia: Secondary | ICD-10-CM | POA: Insufficient documentation

## 2015-01-01 HISTORY — DX: Cardiac murmur, unspecified: R01.1

## 2015-01-01 LAB — URINALYSIS, ROUTINE W REFLEX MICROSCOPIC
BILIRUBIN URINE: NEGATIVE
Glucose, UA: NEGATIVE mg/dL
HGB URINE DIPSTICK: NEGATIVE
KETONES UR: NEGATIVE mg/dL
Leukocytes, UA: NEGATIVE
NITRITE: NEGATIVE
PH: 5.5 (ref 5.0–8.0)
Protein, ur: NEGATIVE mg/dL
Specific Gravity, Urine: >1.03 — ABNORMAL HIGH (ref 1.005–1.030)
Urobilinogen, UA: 0.2 mg/dL (ref 0.0–1.0)

## 2015-01-01 LAB — BASIC METABOLIC PANEL
Anion gap: 9 (ref 5–15)
BUN: 33 mg/dL — ABNORMAL HIGH (ref 6–20)
CALCIUM: 9 mg/dL (ref 8.9–10.3)
CO2: 32 mmol/L (ref 22–32)
CREATININE: 1.44 mg/dL — AB (ref 0.61–1.24)
Chloride: 95 mmol/L — ABNORMAL LOW (ref 101–111)
GFR calc non Af Amer: >60 mL/min (ref >60)
Glucose, Bld: 82 mg/dL (ref 65–99)
Potassium: 4.5 mmol/L (ref 3.5–5.1)
SODIUM: 136 mmol/L (ref 135–145)

## 2015-01-01 LAB — CBC
HCT: 44.9 % (ref 39.0–52.0)
Hemoglobin: 16 g/dL (ref 13.0–17.0)
MCH: 32.3 pg (ref 26.0–34.0)
MCHC: 35.6 g/dL (ref 30.0–36.0)
MCV: 90.5 fL (ref 78.0–100.0)
PLATELETS: 267 10*3/uL (ref 150–400)
RBC: 4.96 MIL/uL (ref 4.22–5.81)
RDW: 12.1 % (ref 11.5–15.5)
WBC: 9.2 10*3/uL (ref 4.0–10.5)

## 2015-01-01 LAB — CK: CK TOTAL: 375 U/L (ref 49–397)

## 2015-01-01 MED ORDER — SODIUM CHLORIDE 0.9 % IV BOLUS (SEPSIS)
1000.0000 mL | Freq: Once | INTRAVENOUS | Status: AC
  Administered 2015-01-01: 1000 mL via INTRAVENOUS

## 2015-01-01 MED ORDER — KETOROLAC TROMETHAMINE 30 MG/ML IJ SOLN
30.0000 mg | Freq: Once | INTRAMUSCULAR | Status: AC
  Administered 2015-01-01: 30 mg via INTRAVENOUS
  Filled 2015-01-01: qty 1

## 2015-01-01 NOTE — ED Notes (Signed)
Pt states that he got really hot yesterday with muscle cramps and emesis x1 yesterday, today with SOB with exertion and palpitations

## 2015-01-01 NOTE — ED Provider Notes (Signed)
CSN: [id number]     Arrival date & time 01/01/15  2133 History  This chart was scribed for Shon Baton, MD by Murriel Hopper, ED Scribe. This patient was seen in room APA04/APA04 and the patient's care was started at 11:03 PM.    Chief Complaint  Patient presents with  . Heat Exposure      The history is provided by the patient. No language interpreter was used.   HPI Comments: Douglas Dennis is a 29 y.o. male who presents to the Emergency Department complaining of a heat exposure that occurred yesterday when pt was working outside.  Patient reports muscle cramping all over. 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 Pt states that he vomited shortly afterwards and then went home and went to sleep. Pt states when he woke up he felt nauseous. Pt reports having a decreased appetite today but consuming fluids normally. Pt also reports having 3 bowel movements this morning, which is more than normal. Pt denies fevers, chest pains. Pt denies smoking or drinking regularly.   Past Medical History  Diagnosis Date  . Heart murmur    History reviewed. No pertinent past surgical history. History reviewed. No pertinent family history. Social History  Substance Use Topics  . Smoking status: Never Smoker   . Smokeless tobacco: None  . Alcohol Use: No     Comment: pt denies    Review of Systems  Constitutional: Negative.  Negative for fever.  Respiratory: Positive for shortness of breath. Negative for chest tightness.   Cardiovascular: Negative.  Negative for chest pain.  Gastrointestinal: Positive for nausea and vomiting. Negative for abdominal pain and diarrhea.  Genitourinary: Negative.  Negative for dysuria.  Musculoskeletal: Positive for myalgias.  Neurological: Negative for syncope and headaches.  All other systems reviewed and are negative.     Allergies  Tramadol  Home  Medications   Prior to Admission medications   Medication Sig Start Date End Date Taking? Authorizing Provider  ibuprofen (ADVIL,MOTRIN) 200 MG tablet Take 200 mg by mouth every 6 (six) hours as needed for mild pain or moderate pain.   Yes Historical Provider, MD   BP 128/75 mmHg  Pulse 68  Temp(Src) 97.6 F (36.4 C) (Oral)  Resp 18  Ht 6' (1.829 m)  Wt 155 lb (70.308 kg)  BMI 21.02 kg/m2  SpO2 100% Physical Exam  Constitutional: He is oriented to person, place, and time. He appears well-developed and well-nourished. No distress.  HENT:  Head: Normocephalic and atraumatic.  Mucous membranes dry  Cardiovascular: Normal rate, regular rhythm and normal heart sounds.   No murmur heard. Pulmonary/Chest: Effort normal and breath sounds normal. No respiratory distress. He has no wheezes.  Abdominal: Soft. Bowel sounds are normal. There is no tenderness. There is no rebound.  Musculoskeletal: He exhibits no edema or tenderness.  Neurological: He is alert and oriented to person, place, and time.  Skin: Skin is warm and dry.  Psychiatric: He has a normal mood and affect.  Nursing note and vitals reviewed.   ED Course  Procedures (including critical care time)  DIAGNOSTIC STUDIES: Oxygen Saturation is 100% on room air, normal by my interpretation.    COORDINATION OF CARE: 11:07 PM Discussed treatment plan with pt at bedside and pt agreed to plan.   Labs Review Labs Reviewed  BASIC METABOLIC PANEL - Abnormal; Notable for the  following:    Chloride 95 (*)    BUN 33 (*)    Creatinine, Ser 1.44 (*)    All other components within normal limits  URINALYSIS, ROUTINE W REFLEX MICROSCOPIC (NOT AT [facility]) - Abnormal; Notable for the following:    Specific Gravity, Urine >1.030 (*)    All other components within normal limits  CBC  CK    Imaging Review Dg Chest 2 View  01/02/2015   CLINICAL DATA:  Shortness of breath.  Recent heat exposure.  Nausea.  EXAM: CHEST  2 VIEW  COMPARISON:   None.  FINDINGS: The cardiomediastinal contours are normal. Mild hyperinflation. Pulmonary vasculature is normal. No consolidation, pleural effusion, or pneumothorax. There is scoliotic curvature of the thoracic spine. No acute osseous abnormalities are seen.  IMPRESSION: 1. Mild hyperinflation.  No localizing process. 2. Mild scoliotic curvature of the thoracic spine.   Electronically Signed   By: Rubye Oaks M.D.   On: 01/02/2015 01:43   I have personally reviewed and evaluated these images and lab results as part of my medical decision-making.   EKG Interpretation   Date/Time:  Wednesday January 01 2015 23:24:43 EDT Ventricular Rate:  77 PR Interval:  143 QRS Duration: 93 QT Interval:  358 QTC Calculation: 405 R Axis:   92 Text Interpretation:  Sinus rhythm Borderline right axis deviation LVH by  voltage Confirmed by HORTON  MD, COURTNEY ([id number]) on 01/01/2015 11:59:58 PM      MDM   Final diagnoses:  Dehydration  Heat exhaustion, initial encounter    Patient presents with myalgias after working in the ER yesterday. One episode of emesis and generalized malaise today. Nontoxic on exam. Vital signs are reassuring. Lab work obtained and shows evidence of mild AK out a creatinine of 1.44. CK is normal. No evidence of rhabdomyolysis. Patient was given fluids and Toradol.  EKG and chest x-ray are reassuring. No evidence of pneumonia, pneumothorax or any other injury. Given patient's age and risk factors, suspect shortness of breath likely secondary to generalized fatigue and heat exhaustion. Discussed with patient aggressive hydration at home.  After history, exam, and medical workup I feel the patient has been appropriately medically screened and is safe for discharge home. Pertinent diagnoses were discussed with the patient. Patient was given return precautions.  I personally performed the services described in this documentation, which was scribed in my presence. The recorded  information has been reviewed and is accurate.    Shon Baton, MD 01/02/15 [phone]

## 2015-01-02 ENCOUNTER — Emergency Department (HOSPITAL_COMMUNITY): Payer: Self-pay

## 2015-01-02 NOTE — Discharge Instructions (Signed)
You were seen today for muscle cramping. This is likely related to dehydration and heat exhaustion. It is very important that you aggressively hydrate.  Heat-Related Illness Heat-related illnesses occur when the body is unable to properly cool itself. The body normally cools itself by sweating. However, under some conditions sweating is not enough. In these cases, a person's body temperature rises rapidly. Very high body temperatures may damage the brain or other vital organs. Some examples of heat-related illnesses include:  Heat stroke. This occurs when the body is unable to regulate its temperature. The body's temperature rises rapidly, the sweating mechanism fails, and the body is unable to cool down. Body temperature may rise to 106 F (41 C) or higher within 10 to 15 minutes. Heat stroke can cause death or permanent disability if emergency treatment is not provided.  Heat exhaustion. This is a milder form of heat-related illness that can develop after several days of exposure to high temperatures and not enough fluids. It is the body's response to an excessive loss of the water and salt contained in sweat.  Heat cramps. These usually affect people who sweat a lot during heavy activity. This sweating drains the body's salt and moisture. The low salt level in the muscles causes painful cramps. Heat cramps may also be a symptom of heat exhaustion. Heat cramps usually occur in the abdomen, arms, or legs. Get medical attention for cramps if you have heart problems or are on a low-sodium diet. Those that are at greatest risk for heat-related illnesses include:   The elderly.  Infant and the very young.  People with mental illness and chronic diseases.  People who are overweight (obese).  Young and healthy people can even succumb to heat if they participate in strenuous physical activities during hot weather. CAUSES  Several factors affect the body's ability to cool itself during extremely hot  weather. When the humidity is high, sweat will not evaporate as quickly. This prevents the body from releasing heat quickly. Other factors that can affect the body's ability to cool down include:   Age.  Obesity.  Fever.  Dehydration.  Heart disease.  Mental illness.  Poor circulation.  Sunburn.  Prescription drug use.  Alcohol use. SYMPTOMS  Heat stroke: Warning signs of heat stroke vary, but may include:  An extremely high body temperature (above 103F orally).  A fast, strong pulse.  Dizziness.  Confusion.  Red, hot, and dry skin.  No sweating.  Throbbing headache.  Feeling sick to your stomach (nauseous).  Unconsciousness. Heat exhaustion: Warning signs of heat exhaustion include:  Heavy sweating.  Tiredness.  Headache.  Paleness.  Weakness.  Feeling sick to your stomach (nauseous) or vomiting.  Muscle cramps. Heat cramps  Muscle pains or spasms. TREATMENT  Heat stroke  Get into a cool environment. An indoor place that is air-conditioned may be best.  Take a cool shower or bath. Have someone around to make sure you are okay.  Take your temperature. Make sure it is going down. Heat exhaustion  Drink plenty of fluids. Do not drink liquids that contain caffeine, alcohol, or large amounts of sugar. These cause you to lose more body fluid. Also, avoid very cold drinks. They can cause stomach cramps.  Get into a cool environment. An indoor place that is air-conditioned may be best.  Take a cool shower or bath. Have someone around to make sure you are okay.  Put on lightweight clothing. Heat cramps  Stop whatever activity you were doing. Do  not attempt to do that activity for at least 3 hours after the cramps have gone away.  Get into a cool environment. An indoor place that is air-conditioned may be best. HOME CARE INSTRUCTIONS  To protect your health when temperatures are extremely high, follow these tips:  During heavy exercise in a  hot environment, drink two to four glasses (16-32 ounces) of cool fluids each hour. Do not wait until you are thirsty to drink. Warning: If your caregiver limits the amount of fluid you drink or has you on water pills, ask how much you should drink while the weather is hot.  Do not drink liquids that contain caffeine, alcohol, or large amounts of sugar. These cause you to lose more body fluid.  Avoid very cold drinks. They can cause stomach cramps.  Wear appropriate clothing. Choose lightweight, light-colored, loose-fitting clothing.  If you must be outdoors, try to limit your outdoor activity to morning and evening hours. Try to rest often in shady areas.  If you are not used to working or exercising in a hot environment, start slowly and pick up the pace gradually.  Stay cool in an air-conditioned place if possible. If your home does not have air conditioning, go to the shopping mall or [employer].  Taking a cool shower or bath may help you cool off. SEEK MEDICAL CARE IF:   You see any of the symptoms listed above. You may be dealing with a life-threatening emergency.  Symptoms worsen or last longer than 1 hour.  Heat cramps do not get better in 1 hour. MAKE SURE YOU:   Understand these instructions.  Will watch your condition.  Will get help right away if you are not doing well or get worse. Document Released: 01/13/2008 Document Revised: 06/28/2011 Document Reviewed: 01/13/2008 [facility] Patient Information 2015 [place], [place]. This information is not intended to replace advice given to you by your health care provider. Make sure you discuss any questions you have with your health care provider.  Dehydration, Adult Dehydration is when you lose more fluids from the body than you take in. Vital organs like the kidneys, brain, and heart cannot function without a proper amount of fluids and salt. Any loss of fluids from the body can cause dehydration.  CAUSES    Vomiting.  Diarrhea.  Excessive sweating.  Excessive urine output.  Fever. SYMPTOMS  Mild dehydration  Thirst.  Dry lips.  Slightly dry mouth. Moderate dehydration  Very dry mouth.  Sunken eyes.  Skin does not bounce back quickly when lightly pinched and released.  Dark urine and decreased urine production.  Decreased tear production.  Headache. Severe dehydration  Very dry mouth.  Extreme thirst.  Rapid, weak pulse (more than 100 beats per minute at rest).  Cold hands and feet.  Not able to sweat in spite of heat and temperature.  Rapid breathing.  Blue lips.  Confusion and lethargy.  Difficulty being awakened.  Minimal urine production.  No tears. DIAGNOSIS  Your caregiver will diagnose dehydration based on your symptoms and your exam. Blood and urine tests will help confirm the diagnosis. The diagnostic evaluation should also identify the cause of dehydration. TREATMENT  Treatment of mild or moderate dehydration can often be done at home by increasing the amount of fluids that you drink. It is best to drink small amounts of fluid more often. Drinking too much at one time can make vomiting worse. Refer to the home care instructions below. Severe dehydration needs to be treated  at the hospital where you will probably be given intravenous (IV) fluids that contain water and electrolytes. HOME CARE INSTRUCTIONS   Ask your caregiver about specific rehydration instructions.  Drink enough fluids to keep your urine clear or pale yellow.  Drink small amounts frequently if you have nausea and vomiting.  Eat as you normally do.  Avoid:  Foods or drinks high in sugar.  Carbonated drinks.  Juice.  Extremely hot or cold fluids.  Drinks with caffeine.  Fatty, greasy foods.  Alcohol.  Tobacco.  Overeating.  Gelatin desserts.  Wash your hands well to avoid spreading bacteria and viruses.  Only take over-the-counter or prescription  medicines for pain, discomfort, or fever as directed by your caregiver.  Ask your caregiver if you should continue all prescribed and over-the-counter medicines.  Keep all follow-up appointments with your caregiver. SEEK MEDICAL CARE IF:  You have abdominal pain and it increases or stays in one area (localizes).  You have a rash, stiff neck, or severe headache.  You are irritable, sleepy, or difficult to awaken.  You are weak, dizzy, or extremely thirsty. SEEK IMMEDIATE MEDICAL CARE IF:   You are unable to keep fluids down or you get worse despite treatment.  You have frequent episodes of vomiting or diarrhea.  You have blood or green matter (bile) in your vomit.  You have blood in your stool or your stool looks black and tarry.  You have not urinated in 6 to 8 hours, or you have only urinated a small amount of very dark urine.  You have a fever.  You faint. MAKE SURE YOU:   Understand these instructions.  Will watch your condition.  Will get help right away if you are not doing well or get worse. Document Released: 04/05/2005 Document Revised: 06/28/2011 Document Reviewed: 11/23/2010 [facility] Patient Information 2015 [place], [place]. This information is not intended to replace advice given to you by your health care provider. Make sure you discuss any questions you have with your health care provider.

## 2015-12-28 ENCOUNTER — Emergency Department (HOSPITAL_COMMUNITY)
Admission: EM | Admit: 2015-12-28 | Discharge: 2015-12-29 | Disposition: A | Discharge status: Home / Self Care | Payer: Self-pay | Attending: Dermatology | Admitting: Dermatology

## 2015-12-28 ENCOUNTER — Encounter (HOSPITAL_COMMUNITY): Payer: Self-pay | Admitting: Emergency Medicine

## 2015-12-28 DIAGNOSIS — Y939 Activity, unspecified: Secondary | ICD-10-CM | POA: Insufficient documentation

## 2015-12-28 DIAGNOSIS — Y999 Unspecified external cause status: Secondary | ICD-10-CM | POA: Insufficient documentation

## 2015-12-28 DIAGNOSIS — Y929 Unspecified place or not applicable: Secondary | ICD-10-CM | POA: Insufficient documentation

## 2015-12-28 DIAGNOSIS — S61217A Laceration without foreign body of left little finger without damage to nail, initial encounter: Secondary | ICD-10-CM | POA: Insufficient documentation

## 2015-12-28 DIAGNOSIS — Z791 Long term (current) use of non-steroidal anti-inflammatories (NSAID): Secondary | ICD-10-CM | POA: Insufficient documentation

## 2015-12-28 DIAGNOSIS — Z5321 Procedure and treatment not carried out due to patient leaving prior to being seen by health care provider: Secondary | ICD-10-CM | POA: Insufficient documentation

## 2015-12-28 DIAGNOSIS — S61213A Laceration without foreign body of left middle finger without damage to nail, initial encounter: Secondary | ICD-10-CM | POA: Insufficient documentation

## 2015-12-28 MED ORDER — TETANUS-DIPHTH-ACELL PERTUSSIS 5-2.5-18.5 LF-MCG/0.5 IM SUSP: 0.5000 mL | Freq: Once | INTRAMUSCULAR | Status: DC
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
## 2015-12-28 NOTE — ED Notes (Signed)
No answer when called to patient room 

## 2015-12-29 NOTE — ED Notes (Signed)
No answer when called to patient room 

## 2016-01-27 ENCOUNTER — Emergency Department (HOSPITAL_COMMUNITY)
Admission: EM | Admit: 2016-01-27 | Discharge: 2016-01-27 | Disposition: A | Discharge status: Home / Self Care | Payer: Self-pay | Attending: Dermatology | Admitting: Dermatology

## 2016-01-27 ENCOUNTER — Encounter (HOSPITAL_COMMUNITY): Payer: Self-pay | Admitting: Emergency Medicine

## 2016-01-27 DIAGNOSIS — R079 Chest pain, unspecified: Secondary | ICD-10-CM | POA: Insufficient documentation

## 2016-01-27 DIAGNOSIS — Z5321 Procedure and treatment not carried out due to patient leaving prior to being seen by health care provider: Secondary | ICD-10-CM | POA: Insufficient documentation

## 2016-01-27 DIAGNOSIS — Z791 Long term (current) use of non-steroidal anti-inflammatories (NSAID): Secondary | ICD-10-CM | POA: Insufficient documentation

## 2016-01-27 NOTE — ED Notes (Signed)
Per pt's girlfriend pt does not want to wait any longer to be treated. States he needs a noted stating he was here.
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
## 2016-01-27 NOTE — ED Triage Notes (Signed)
Pt reports L sided chest pain that started 4 days ago. Pt states he has been having some SOB with the pain.

## 2016-04-29 ENCOUNTER — Emergency Department (HOSPITAL_COMMUNITY)
Admission: EM | Admit: 2016-04-29 | Discharge: 2016-04-29 | Disposition: A | Discharge status: Home / Self Care | Payer: Self-pay | Attending: Emergency Medicine | Admitting: Emergency Medicine

## 2016-04-29 ENCOUNTER — Encounter (HOSPITAL_COMMUNITY): Payer: Self-pay | Admitting: *Deleted

## 2016-04-29 DIAGNOSIS — W228XXA Striking against or struck by other objects, initial encounter: Secondary | ICD-10-CM | POA: Insufficient documentation

## 2016-04-29 DIAGNOSIS — S40812A Abrasion of left upper arm, initial encounter: Secondary | ICD-10-CM | POA: Insufficient documentation

## 2016-04-29 DIAGNOSIS — Y929 Unspecified place or not applicable: Secondary | ICD-10-CM | POA: Insufficient documentation

## 2016-04-29 DIAGNOSIS — Y9339 Activity, other involving climbing, rappelling and jumping off: Secondary | ICD-10-CM | POA: Insufficient documentation

## 2016-04-29 DIAGNOSIS — Y998 Other external cause status: Secondary | ICD-10-CM | POA: Insufficient documentation

## 2016-04-29 DIAGNOSIS — Z23 Encounter for immunization: Secondary | ICD-10-CM | POA: Insufficient documentation

## 2016-04-29 MED ORDER — TETANUS-DIPHTH-ACELL PERTUSSIS 5-2.5-18.5 LF-MCG/0.5 IM SUSP
0.5000 mL | Freq: Once | INTRAMUSCULAR | Status: AC
Start: 2016-04-29 — End: 2016-04-29
  Administered 2016-04-29: 0.5 mL via INTRAMUSCULAR
  Filled 2016-04-29: qty 0.5

## 2016-04-29 NOTE — ED Provider Notes (Signed)
[facility]-EMERGENCY DEPT Provider Note   CSN: [id number] Arrival date & time: 04/29/16  0138     History   Chief Complaint Chief Complaint  Patient presents with  . Arm Pain  Patient gave verbal permission to utilize photo for medical documentation only The image was not stored on any personal device   HPI Douglas Dennis is a 31 y.o. male.  The history is provided by the patient.  Arm Pain  This is a new problem. The current episode started less than 1 hour ago. The problem occurs constantly. The problem has not changed since onset.Nothing aggravates the symptoms. Nothing relieves the symptoms.  pt reports he was climbing onto roof to retrieve his keys when he scratched his left arm.  No trauma or other injury to arm, but since then it feels like his arm is "asleep" and his finger keep "curling" in No other complaints at this time   Past Medical History:  Diagnosis Date  . Heart murmur     Patient Active Problem List   Diagnosis Date Noted  . 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 Suicide attempt 12/27/2014    Past Surgical History:  Procedure Laterality Date  . FRACTURE SURGERY         Home Medications    Prior to Admission medications   Medication Sig Start Date End Date Taking? Authorizing Provider  ibuprofen (ADVIL,MOTRIN) 200 MG tablet Take 200 mg by mouth every 6 (six) hours as needed for mild pain or moderate pain.    Historical Provider, MD    Family History Family History  Problem Relation Age of Onset  . Heart disease Father     Social History Social History  Substance Use Topics  . Smoking status: Never Smoker  . Smokeless tobacco: Never Used  . Alcohol use 1.2 oz/week    2 Cans of beer per week     Comment: weekends     Allergies   Tramadol   Review of Systems Review of Systems  Constitutional: Negative for fever.  Skin: Positive for wound.  Neurological: Positive for numbness.      Physical Exam Updated Vital Signs BP (!) 139/105 (BP Location: Right Arm)   Pulse 93   Temp 97.5 F (36.4 C) (Oral)   Resp 16   Ht 6' (1.829 m)   Wt 72.6 kg   SpO2 97%   BMI 21.70 kg/m   Physical Exam CONSTITUTIONAL: Well developed/well nourished HEAD: Normocephalic/atraumatic EYES: EOMI ENMT: Mucous membranes moist NECK: supple no meningeal signs CV: S1/S2 noted LUNGS: Lungs are clear to auscultation bilaterally ABDOMEN: soft NEURO: Pt is awake/alert/appropriate, moves all extremitiesx4.  No facial droop.   Equal power  with hand grip, wrist flex/extension, elbow flex/extension, Distal radial/media/ulnar motor intact, he reports numbness throughout left forearm EXTREMITIES: pulses normal/equal, full ROM SKIN: warm, color normal, see photo PSYCH: no abnormalities of mood noted, alert and oriented to situation     ED Treatments / Results  Labs (all labs ordered are listed, but only abnormal results are displayed) Labs Reviewed - No data to display  EKG  EKG Interpretation None       Radiology No results found.  Procedures Procedures (including critical care time)  Medications Ordered in ED Medications  Tdap (BOOSTRIX) injection 0.5 mL (not administered)     Initial Impression / Assessment and Plan / ED Course  I have reviewed the triage vital signs and the nursing notes.    Clinical  Course       Final Clinical Impressions(s) / ED Diagnoses   Final diagnoses:  Abrasion of left upper extremity, initial encounter    New Prescriptions New Prescriptions   No medications on file     Zadie Rhine, MD 04/29/16 [phone]

## 2016-04-29 NOTE — ED Triage Notes (Signed)
Pt c/o pain to left arm; pt states he scraped his arm on a tin roof 15 mins pta; pt has redness to inner left arm and pt states his fingers have a tingling feeling

## 2016-06-17 ENCOUNTER — Encounter (HOSPITAL_COMMUNITY): Payer: Self-pay | Admitting: *Deleted

## 2016-06-17 ENCOUNTER — Emergency Department (HOSPITAL_COMMUNITY)
Admission: EM | Admit: 2016-06-17 | Discharge: 2016-06-17 | Disposition: A | Discharge status: Home / Self Care | Payer: Self-pay | Attending: Emergency Medicine | Admitting: Emergency Medicine

## 2016-06-17 DIAGNOSIS — R1110 Vomiting, unspecified: Secondary | ICD-10-CM | POA: Insufficient documentation

## 2016-06-17 DIAGNOSIS — K047 Periapical abscess without sinus: Secondary | ICD-10-CM | POA: Insufficient documentation

## 2016-06-17 DIAGNOSIS — K029 Dental caries, unspecified: Secondary | ICD-10-CM | POA: Insufficient documentation

## 2016-06-17 MED ORDER — AMOXICILLIN 500 MG PO CAPS: 500.0000 mg | ORAL_CAPSULE | Freq: Three times a day (TID) | ORAL | 0 refills | Status: AC

## 2016-06-17 NOTE — ED Notes (Signed)
Instructed pt to take all of antibiotics as prescribed. 

## 2016-06-17 NOTE — ED Triage Notes (Signed)
Pt c/o right lower toothache, nausea, vomiting x 1, chills and headache that started 2 days ago. Pt reports he has been using Oragel for the pain and has been taking some leftover Amoxicillin from an old prescription that he had. Pt does not have a dentist. Reports fever at home, temp 97.6 in triage.

## 2016-06-17 NOTE — ED Provider Notes (Signed)
[facility]EM[facility]Y DEPT Provider Note   CSN: [id number] Arrival date & time: 06/17/16  1008   By signing my name below, I, Bobbie Stackhristopher Reid, attest that this documentation has been prepared under the direction and in the presence of Cathren LaineKevin Mahki Spikes, MD. Electronically Signed: Bobbie Stackhristopher Reid, Scribe. 06/17/16. 11:11 AM. History   Chief Complaint Chief Complaint  Patient presents with  . Dental Pain    The history is provided by the patient. No language interpreter was used.  HPI Comments: Douglas Dennis is a 31 y.o. male who presents to the Emergency Department complaining of dental pain that began 2 days ago. Subjective fever. Reports 1 episode emesis yesterday, none since.  He states that he has had similar pain in the past and has taken amoxacillin which resolved the issue. He reports taking some left-over amoxacillin which provided no significant relief. He states that yesterday he began to have some focal facial swelling. No trouble breathing or swallowing. The patient does not have a dentist.   Past Medical History:  Diagnosis Date  . Heart murmur     Patient Active Problem List   Diagnosis Date Noted  . 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 Suicide attempt 12/27/2014    Past Surgical History:  Procedure Laterality Date  . FRACTURE SURGERY         Home Medications    Prior to Admission medications   Medication Sig Start Date End Date Taking? Authorizing Provider  ibuprofen (ADVIL,MOTRIN) 200 MG tablet Take 200 mg by mouth every 6 (six) hours as needed for mild pain or moderate pain.    Historical Provider, MD    Family History Family History  Problem Relation Age of Onset  . Heart disease Father     Social History Social History  Substance Use Topics  . Smoking status: Never Smoker  . Smokeless tobacco: Never Used  . Alcohol use 1.2 oz/week    2 Cans of beer per week     Comment: weekends      Allergies   Tramadol   Review of Systems Review of Systems  Constitutional: Positive for fever.  HENT: Positive for dental problem. Negative for sore throat and trouble swallowing.   Eyes: Negative for redness.  Respiratory: Negative for cough and shortness of breath.   Gastrointestinal: Positive for vomiting.  Musculoskeletal: Negative for neck pain.  Neurological: Negative for headaches.  All other systems reviewed and are negative.    Physical Exam Updated Vital Signs BP 117/74   Pulse 63   Temp 97.6 F (36.4 C) (Oral)   Resp 16   Ht 6' (1.829 m)   Wt 160 lb (72.6 kg)   SpO2 100%   BMI 21.70 kg/m   Physical Exam  Constitutional: He appears well-developed and well-nourished. No distress.  HENT:  Mouth/Throat: Oropharynx is clear and moist.  Multiple/widespread dental caries and chronic broken teeth. Dental abscess at tooth number 27.  No trismus.  No swelling to floor of mouth or neck.   Eyes: EOM are normal.  Neck: Normal range of motion. No tracheal deviation present.  Cardiovascular: Normal rate and intact distal pulses.   Pulmonary/Chest: Effort normal. No respiratory distress.  Musculoskeletal: He exhibits no edema.  Lymphadenopathy:    He has no cervical adenopathy.  Neurological: He is alert.  Skin: Skin is warm and dry. He is not diaphoretic.  Psychiatric: He has a normal mood and affect.  Nursing note and vitals reviewed.  ED Treatments / Results  DIAGNOSTIC STUDIES: Oxygen Saturation is 100% on RA, normal by my interpretation.    COORDINATION OF CARE: 10:55 AM Discussed treatment plan with pt at bedside, which includes labs, and pt agreed to plan.   Labs (all labs ordered are listed, but only abnormal results are displayed) Labs Reviewed - No data to display  EKG  EKG Interpretation None       Radiology No results found.  Procedures Procedures (including critical care time)  Medications Ordered in ED Medications - No data  to display   Initial Impression / Assessment and Plan / ED Course  I have reviewed the triage vital signs and the nursing notes.  Pertinent labs & imaging results that were available during my care of the patient were reviewed by me and considered in my medical decision making (see chart for details).  Discussed need for close outpt dental follow up.  Pt states only had 1-2 left over amoxicillin.  rx amoxicillin for suspected dental abscess. rec tylenol/motrin prn.   Pt given dental resource guide.   Pt currently appears stable for d/c.     Final Clinical Impressions(s) / ED Diagnoses   Final diagnoses:  Dental abscess  Dental caries    New Prescriptions New Prescriptions   No medications on file  I personally performed the services described in this documentation, which was scribed in my presence. The recorded information has been reviewed and considered. Cathren Laine, MD    Cathren Laine, MD 06/17/16 [phone]

## 2016-06-17 NOTE — Discharge Instructions (Signed)
It was our pleasure to provide your ER care today - we hope that you feel better.  Take amoxicillin as prescribed.  Take tylenol and motrin as need for pain.  Follow up with dentist in the next few days - call today to arrange appointment.  See dental resource guide.   Return to ER if worse, new symptoms, progressive facial or neck swelling, trouble breathing or swallowing, other concern.

## 2017-02-21 IMAGING — CT CT HEAD W/O CM
1 series · 16 of 30 positions shown, 20 images · non-contrast
Comparison: None.

CLINICAL DATA: 29-year-old with acute onset of slurred speech
approximately 3-1/2 hr ago. Patient complains of severe headache.

EXAM:
CT HEAD WITHOUT CONTRAST
TECHNIQUE: Contiguous axial images were obtained from the base of the skull
through the vertex without intravenous contrast.

[Series 3: headseq 4.8 h37s · axial · 0.45mm/px · z∈[+1042,+1196]mm · 16 of 36 slices shown, 20 images]
[im 2/36  brain]
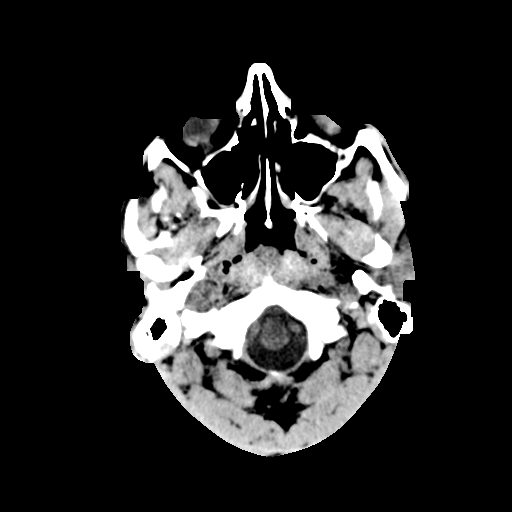
[im 2/36  bone]
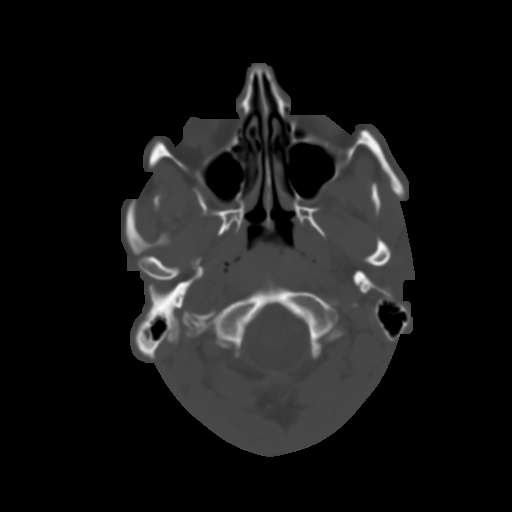
[im 4/36  brain]
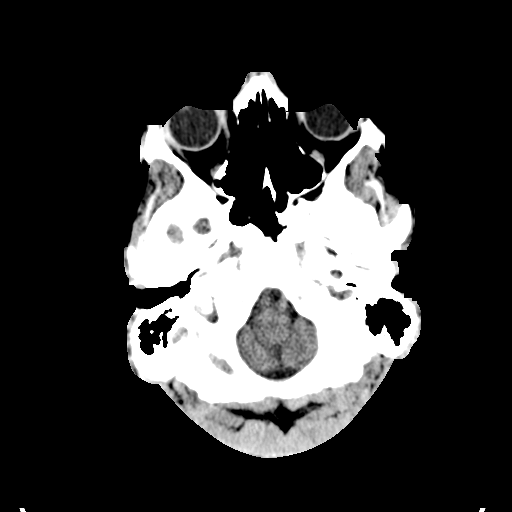
[im 7/36  brain]
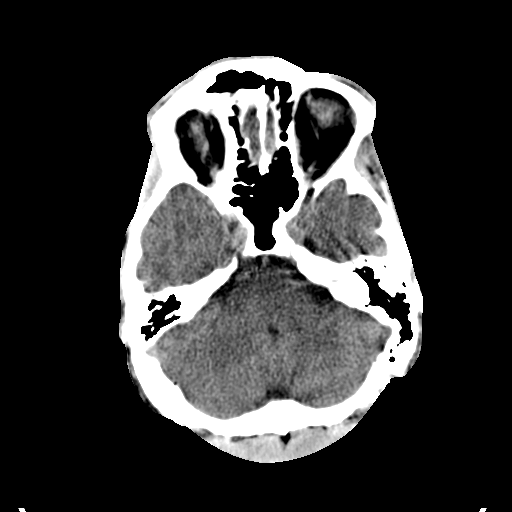
[im 9/36  brain]
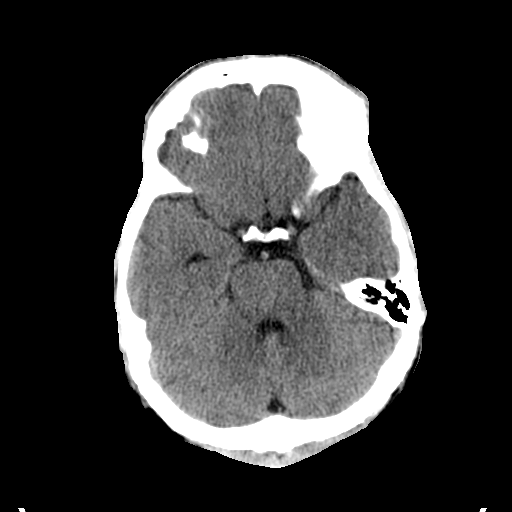
[im 10/36  brain]
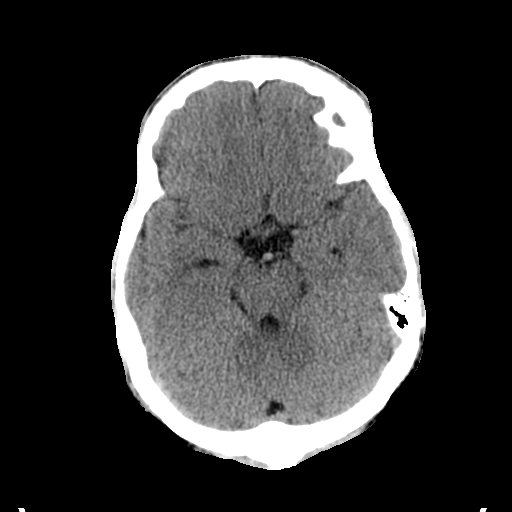
[im 10/36  bone]
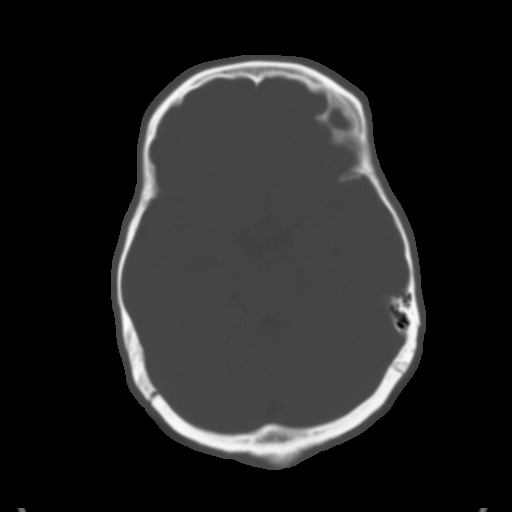
[im 13/36  brain]
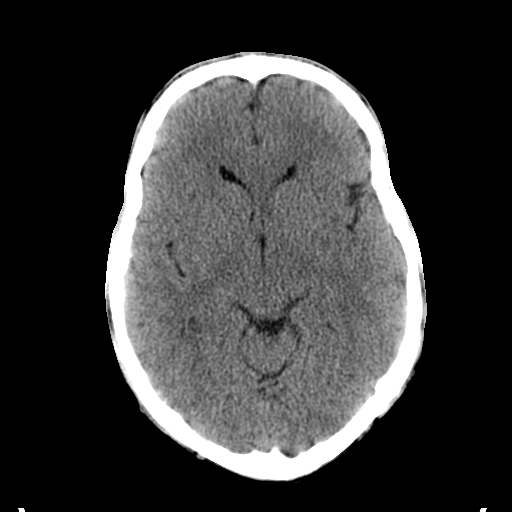
[im 15/36  brain]
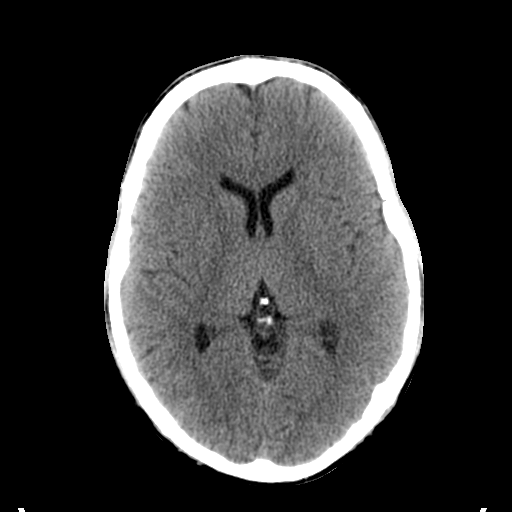
[im 17/36  brain]
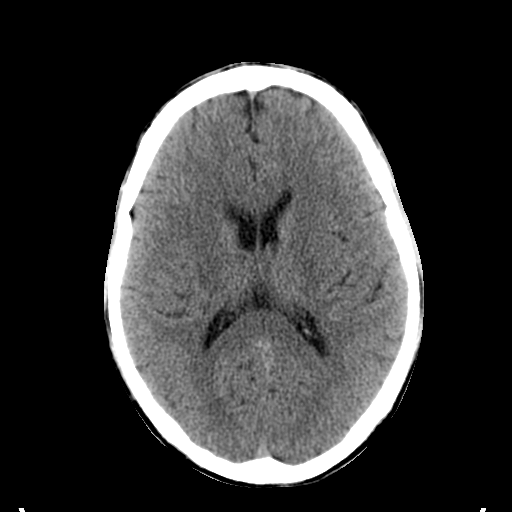
[im 19/36  brain]
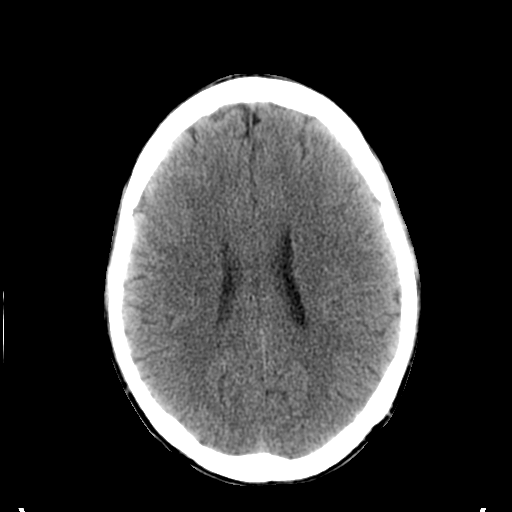
[im 19/36  bone]
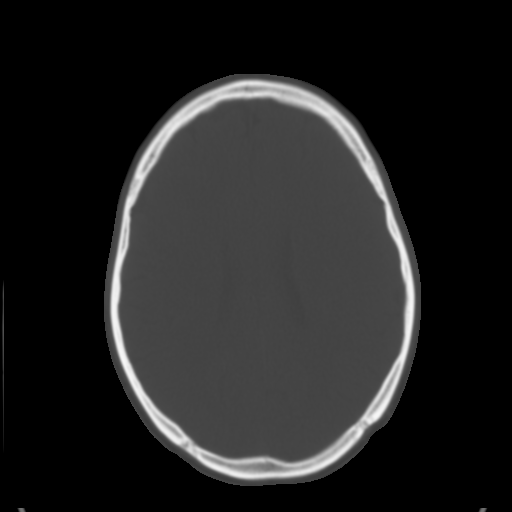
[im 21/36  brain]
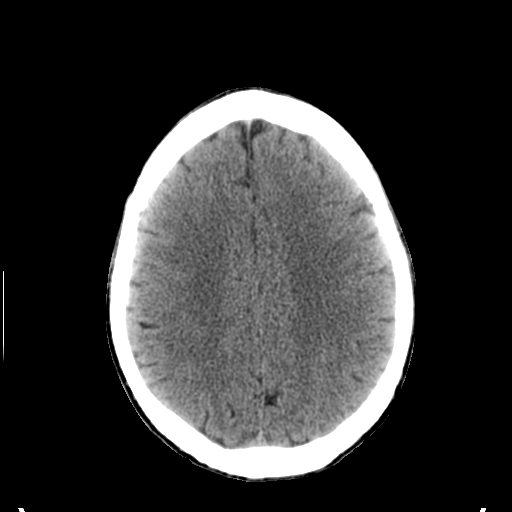
[im 23/36  brain]
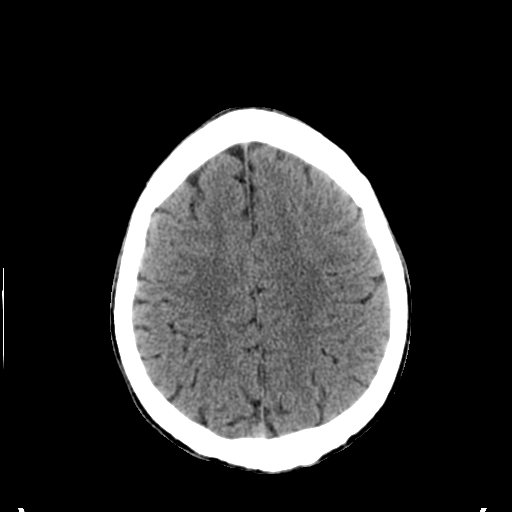
[im 26/36  brain]
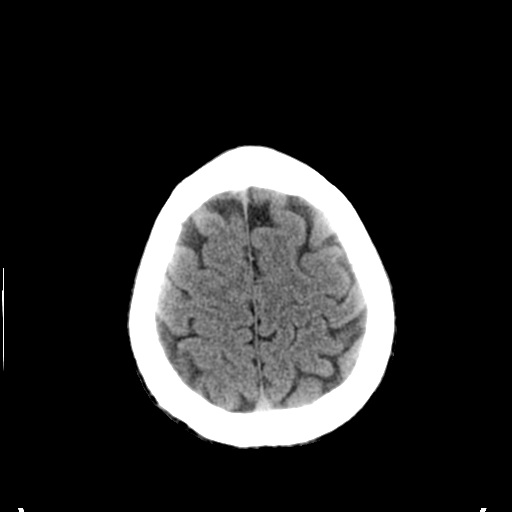
[im 27/36  brain]
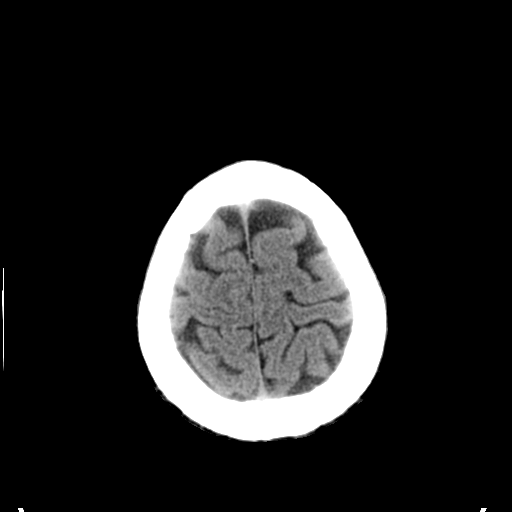
[im 27/36  bone]
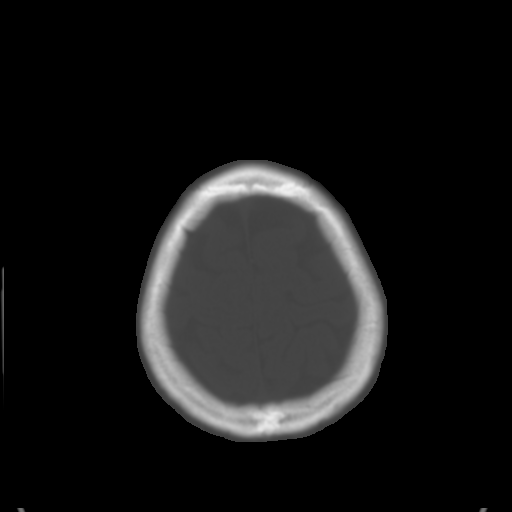
[im 29/36  brain]
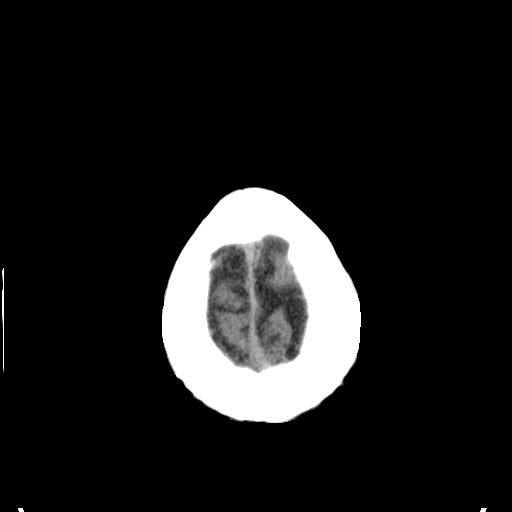
[im 32/36  brain]
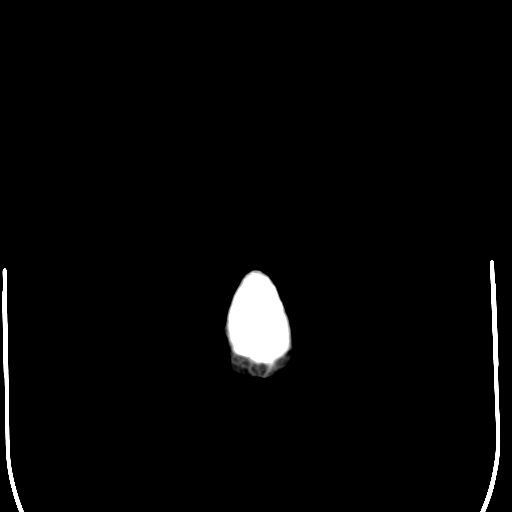
[im 34/36  brain]
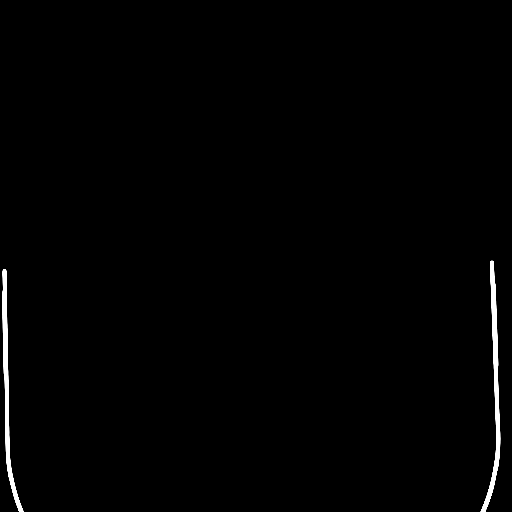

[16 of 30 positions shown; findings below may reference images not displayed]

FINDINGS: Ventricular system normal in size and appearance for age. No mass
lesion. No midline shift. No acute hemorrhage or hematoma. No
extra-axial fluid collections. No evidence of acute infarction. No
focal brain parenchymal abnormalities.

No focal osseous abnormalities involving the skull. Visualized
paranasal sinuses, bilateral mastoid air cells, and bilateral middle
ear cavities well-aerated.
IMPRESSION: Normal examination.

## 2017-02-28 IMAGING — DX DG CHEST 2V
3 series · 3 of 3 positions shown · non-contrast
Comparison: None.

CLINICAL DATA: Shortness of breath.  Recent heat exposure.  Nausea.

EXAM:
CHEST  2 VIEW

[chest pa]
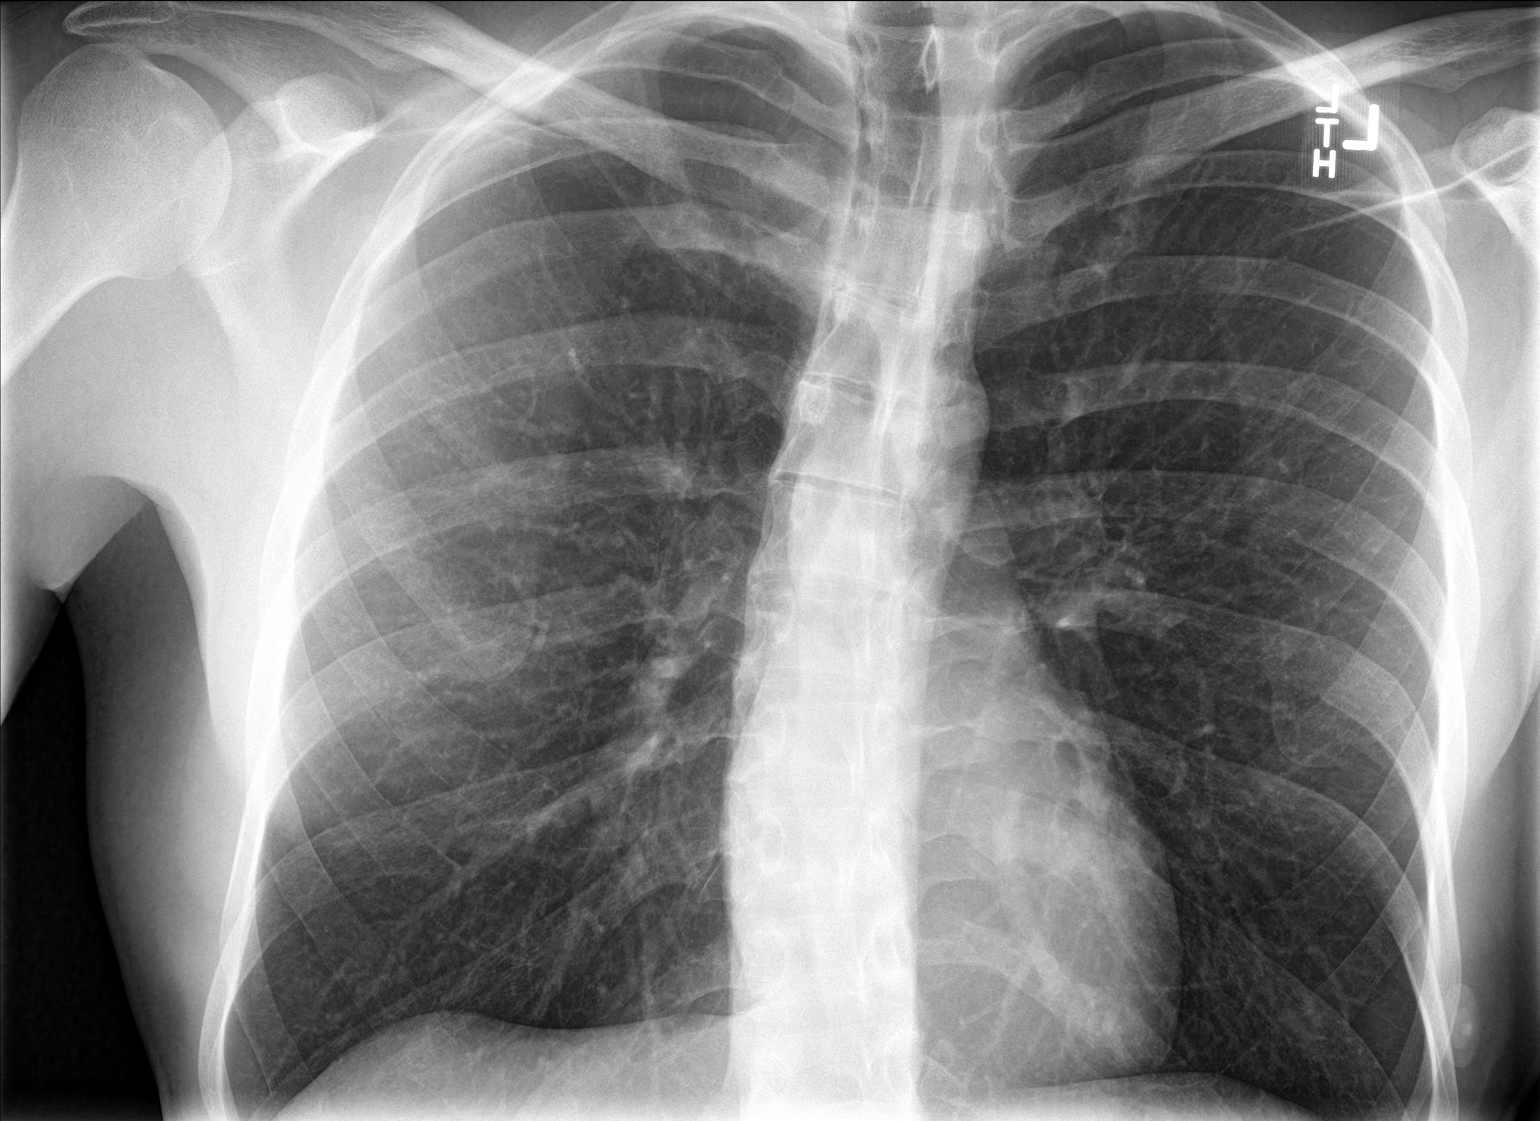

[chest lat]
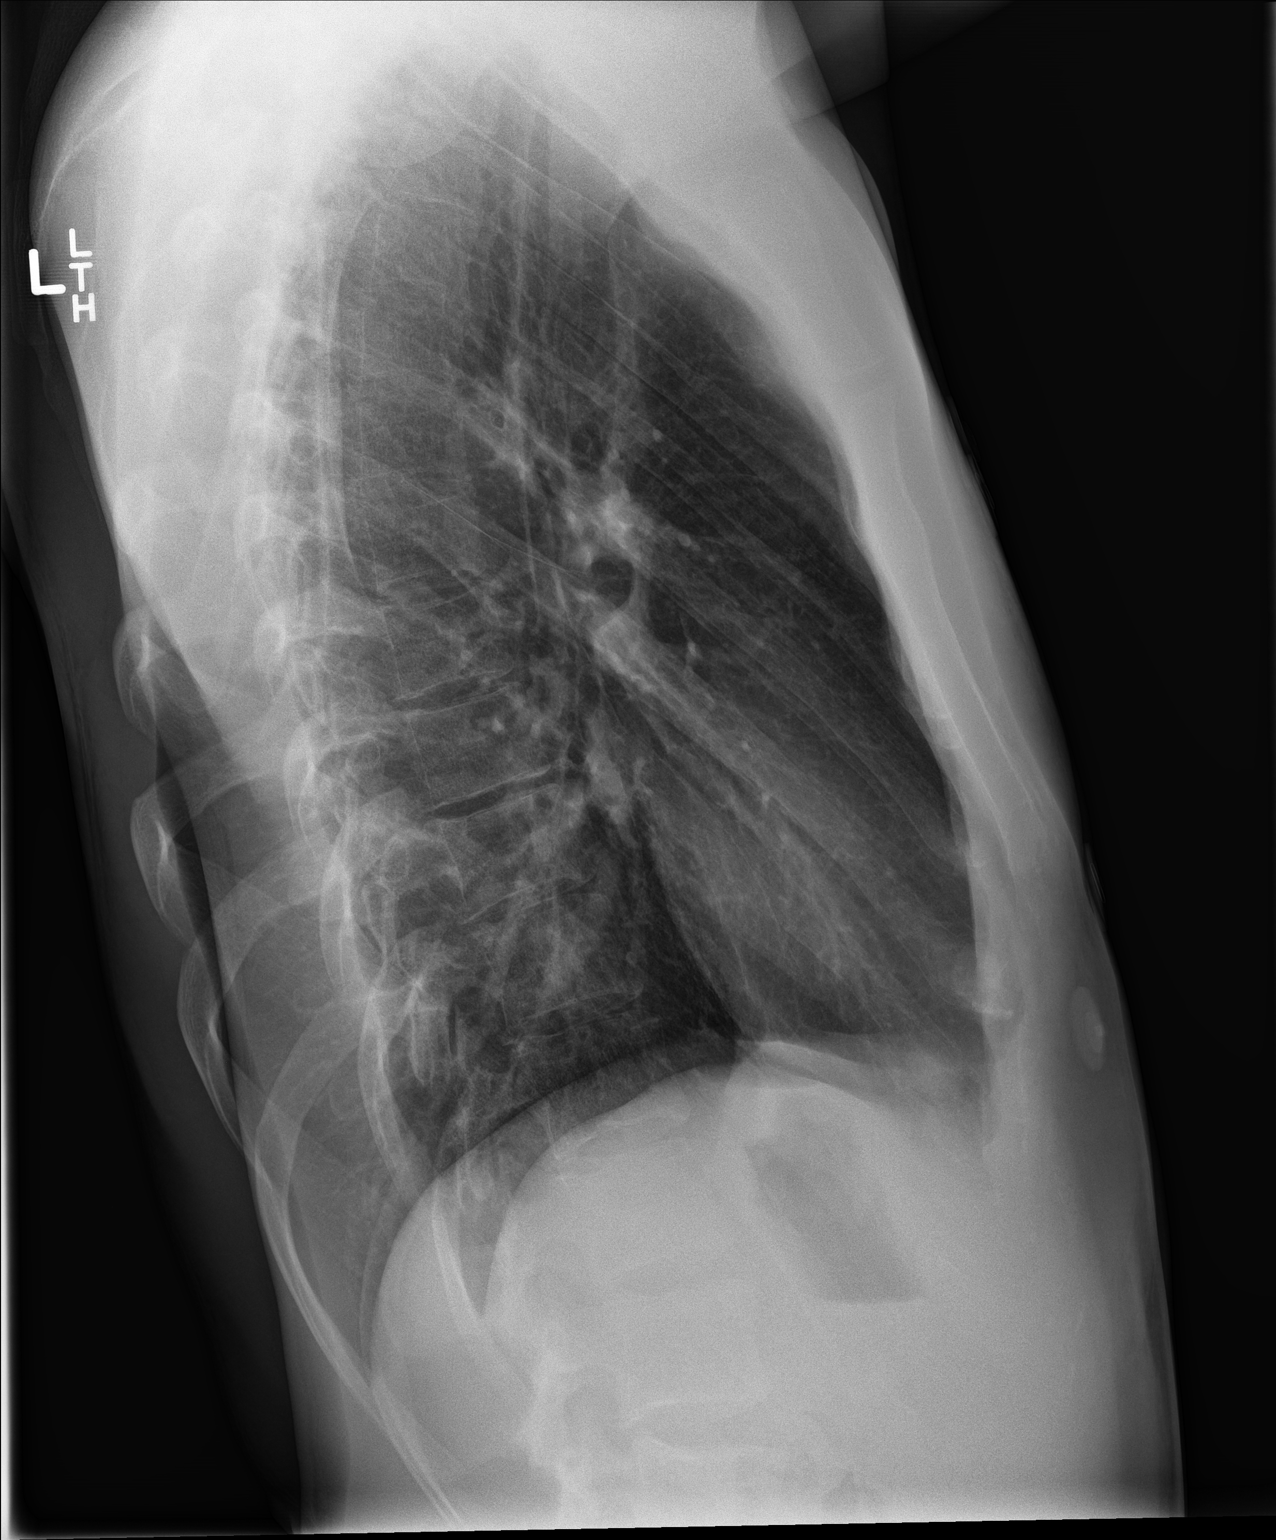

[chest ap]
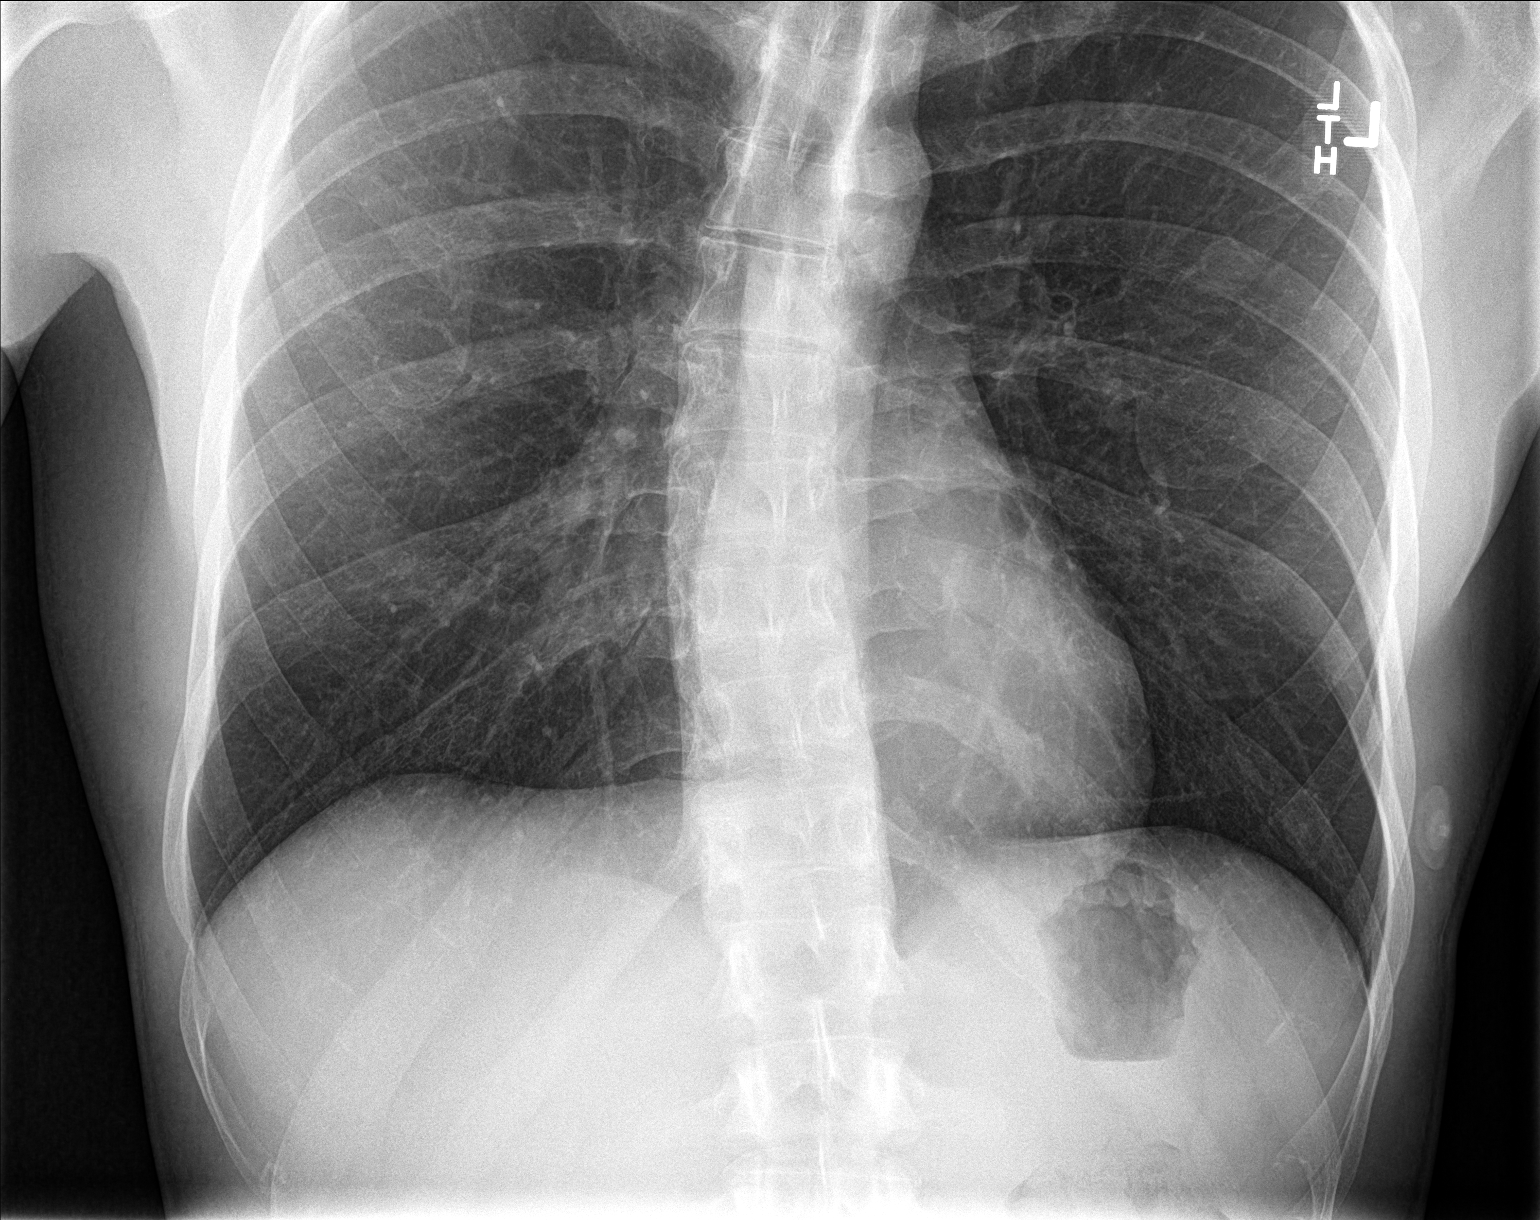

[3 of 3 positions shown; findings below may reference images not displayed]

FINDINGS: The cardiomediastinal contours are normal. Mild hyperinflation.
Pulmonary vasculature is normal. No consolidation, pleural effusion,
or pneumothorax. There is scoliotic curvature of the thoracic spine.
No acute osseous abnormalities are seen.
IMPRESSION: 1. Mild hyperinflation.  No localizing process.
2. Mild scoliotic curvature of the thoracic spine.

## 2020-12-03 ENCOUNTER — Other Ambulatory Visit: Payer: Self-pay

## 2020-12-03 ENCOUNTER — Emergency Department (HOSPITAL_COMMUNITY): Admission: EM | Admit: 2020-12-03 | Discharge: 2020-12-03 | Discharge status: Against Medical Advice | Payer: Self-pay

## 2020-12-03 NOTE — ED Notes (Signed)
Attempted to call for pt.  No answer pt not in lobby

## 2020-12-03 NOTE — ED Notes (Signed)
Attempted to call pt to triage no answer.  Pt not in lobby

## 2020-12-03 NOTE — ED Notes (Signed)
Attempted to call pt to triage.  Pt not in lobby, no answer.
